# Patient Record
Sex: Female | Born: 1995 | Race: Black or African American | Hispanic: No | Marital: Married | Smoking: Never smoker
Health system: Southern US, Community
[De-identification: ages and names within clinical notes are randomized; demographics above are authoritative.]

## PROBLEM LIST (undated history)

## (undated) DIAGNOSIS — O24419 Gestational diabetes mellitus in pregnancy, unspecified control: Secondary | ICD-10-CM

## (undated) DIAGNOSIS — G43909 Migraine, unspecified, not intractable, without status migrainosus: Secondary | ICD-10-CM

## (undated) DIAGNOSIS — J45909 Unspecified asthma, uncomplicated: Secondary | ICD-10-CM

## (undated) HISTORY — DX: Migraine, unspecified, not intractable, without status migrainosus: G43.909

## (undated) HISTORY — DX: Gestational diabetes mellitus in pregnancy, unspecified control: O24.419

## (undated) HISTORY — PX: NO PAST SURGERIES: SHX2092

## (undated) HISTORY — DX: Unspecified asthma, uncomplicated: J45.909

---
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
## 2015-01-12 ENCOUNTER — Ambulatory Visit (INDEPENDENT_AMBULATORY_CARE_PROVIDER_SITE_OTHER): Payer: BLUE CROSS/BLUE SHIELD | Admitting: Internal Medicine

## 2015-01-12 VITALS — BP 124/80 | HR 116 | Temp 99.2°F | Resp 18 | Ht 61.0 in | Wt 115.0 lb

## 2015-01-12 DIAGNOSIS — A09 Infectious gastroenteritis and colitis, unspecified: Secondary | ICD-10-CM

## 2015-01-12 DIAGNOSIS — R110 Nausea: Secondary | ICD-10-CM

## 2015-01-12 LAB — POCT URINALYSIS DIPSTICK
Glucose, UA: NEGATIVE
LEUKOCYTES UA: NEGATIVE
NITRITE UA: NEGATIVE
PROTEIN UA: 30
Spec Grav, UA: 1.025
UROBILINOGEN UA: 1
pH, UA: 6.5

## 2015-01-12 LAB — POCT URINE PREGNANCY: PREG TEST UR: NEGATIVE

## 2015-01-12 LAB — POCT UA - MICROSCOPIC ONLY
Casts, Ur, LPF, POC: NEGATIVE
Crystals, Ur, HPF, POC: NEGATIVE
YEAST UA: NEGATIVE

## 2015-01-12 LAB — POCT CBC
GRANULOCYTE PERCENT: 91.4 % — AB (ref 37–80)
HEMATOCRIT: 45.6 % (ref 37.7–47.9)
HEMOGLOBIN: 14.5 g/dL (ref 12.2–16.2)
Lymph, poc: 0.6 (ref 0.6–3.4)
MCH, POC: 30.1 pg (ref 27–31.2)
MCHC: 31.7 g/dL — AB (ref 31.8–35.4)
MCV: 94.9 fL (ref 80–97)
MID (cbc): 0.2 (ref 0–0.9)
MPV: 8.3 fL (ref 0–99.8)
POC GRANULOCYTE: 9.1 — AB (ref 2–6.9)
POC LYMPH PERCENT: 6.2 %L — AB (ref 10–50)
POC MID %: 2.4 % (ref 0–12)
Platelet Count, POC: 290 10*3/uL (ref 142–424)
RBC: 4.8 M/uL (ref 4.04–5.48)
RDW, POC: 13.6 %
WBC: 10 10*3/uL (ref 4.6–10.2)

## 2015-01-12 LAB — GLUCOSE, POCT (MANUAL RESULT ENTRY): POC Glucose: 117 mg/dl — AB (ref 70–99)

## 2015-01-12 MED ORDER — ONDANSETRON HCL 8 MG PO TABS: 8.0000 mg | ORAL_TABLET | Freq: Three times a day (TID) | ORAL | Status: DC | PRN

## 2015-01-12 MED ORDER — ONDANSETRON 4 MG PO TBDP
8.0000 mg | ORAL_TABLET | Freq: Once | ORAL | Status: AC
  Administered 2015-01-12: 8 mg via ORAL

## 2015-01-12 NOTE — Patient Instructions (Addendum)
Food Choices to Help Relieve Diarrhea When you have diarrhea, the foods you eat and your eating habits are very important. Choosing the right foods and drinks can help relieve diarrhea. Also, because diarrhea can last up to 7 days, you need to replace lost fluids and electrolytes (such as sodium, potassium, and chloride) in order to help prevent dehydration.  WHAT GENERAL GUIDELINES DO I NEED TO FOLLOW?  Slowly drink 1 cup (8 oz) of fluid for each episode of diarrhea. If you are getting enough fluid, your urine will be clear or pale yellow.  Eat starchy foods. Some good choices include white rice, white toast, pasta, low-fiber cereal, baked potatoes (without the skin), saltine crackers, and bagels.  Avoid large servings of any cooked vegetables.  Limit fruit to two servings per day. A serving is  cup or 1 small piece.  Choose foods with less than 2 g of fiber per serving.  Limit fats to less than 8 tsp (38 g) per day.  Avoid fried foods.  Eat foods that have probiotics in them. Probiotics can be found in certain dairy products.  Avoid foods and beverages that may increase the speed at which food moves through the stomach and intestines (gastrointestinal tract). Things to avoid include:  High-fiber foods, such as dried fruit, raw fruits and vegetables, nuts, seeds, and whole grain foods.  Spicy foods and high-fat foods.  Foods and beverages sweetened with high-fructose corn syrup, honey, or sugar alcohols such as xylitol, sorbitol, and mannitol. WHAT FOODS ARE RECOMMENDED? Grains White rice. White, French, or pita breads (fresh or toasted), including plain rolls, buns, or bagels. White pasta. Saltine, soda, or graham crackers. Pretzels. Low-fiber cereal. Cooked cereals made with water (such as cornmeal, farina, or cream cereals). Plain muffins. Matzo. Melba toast. Zwieback.  Vegetables Potatoes (without the skin). Strained tomato and vegetable juices. Most well-cooked and canned  vegetables without seeds. Tender lettuce. Fruits Cooked or canned applesauce, apricots, cherries, fruit cocktail, grapefruit, peaches, pears, or plums. Fresh bananas, apples without skin, cherries, grapes, cantaloupe, grapefruit, peaches, oranges, or plums.  Meat and Other Protein Products Baked or boiled chicken. Eggs. Tofu. Fish. Seafood. Smooth peanut butter. Ground or well-cooked tender beef, ham, veal, lamb, pork, or poultry.  Dairy Plain yogurt, kefir, and unsweetened liquid yogurt. Lactose-free milk, buttermilk, or soy milk. Plain hard cheese. Beverages Sport drinks. Clear broths. Diluted fruit juices (except prune). Regular, caffeine-free sodas such as ginger ale. Water. Decaffeinated teas. Oral rehydration solutions. Sugar-free beverages not sweetened with sugar alcohols. Other Bouillon, broth, or soups made from recommended foods.  The items listed above may not be a complete list of recommended foods or beverages. Contact your dietitian for more options. WHAT FOODS ARE NOT RECOMMENDED? Grains Whole grain, whole wheat, bran, or rye breads, rolls, pastas, crackers, and cereals. Wild or brown rice. Cereals that contain more than 2 g of fiber per serving. Corn tortillas or taco shells. Cooked or dry oatmeal. Granola. Popcorn. Vegetables Raw vegetables. Cabbage, broccoli, Brussels sprouts, artichokes, baked beans, beet greens, corn, kale, legumes, peas, sweet potatoes, and yams. Potato skins. Cooked spinach and cabbage. Fruits Dried fruit, including raisins and dates. Raw fruits. Stewed or dried prunes. Fresh apples with skin, apricots, mangoes, pears, raspberries, and strawberries.  Meat and Other Protein Products Chunky peanut butter. Nuts and seeds. Beans and lentils. Bacon.  Dairy High-fat cheeses. Milk, chocolate milk, and beverages made with milk, such as milk shakes. Cream. Ice cream. Sweets and Desserts Sweet rolls, doughnuts, and sweet breads. Pancakes   and waffles. Fats and  Oils Butter. Cream sauces. Margarine. Salad oils. Plain salad dressings. Olives. Avocados.  Beverages Caffeinated beverages (such as coffee, tea, soda, or energy drinks). Alcoholic beverages. Fruit juices with pulp. Prune juice. Soft drinks sweetened with high-fructose corn syrup or sugar alcohols. Other Coconut. Hot sauce. Chili powder. Mayonnaise. Gravy. Cream-based or milk-based soups.  The items listed above may not be a complete list of foods and beverages to avoid. Contact your dietitian for more information. WHAT SHOULD I DO IF I BECOME DEHYDRATED? Diarrhea can sometimes lead to dehydration. Signs of dehydration include dark urine and dry mouth and skin. If you think you are dehydrated, you should rehydrate with an oral rehydration solution. These solutions can be purchased at pharmacies, retail stores, or online.  Drink -1 cup (120-240 mL) of oral rehydration solution each time you have an episode of diarrhea. If drinking this amount makes your diarrhea worse, try drinking smaller amounts more often. For example, drink 1-3 tsp (5-15 mL) every 5-10 minutes.  A general rule for staying hydrated is to drink 1-2 L of fluid per day. Talk to your health care provider about the specific amount you should be drinking each day. Drink enough fluids to keep your urine clear or pale yellow. Document Released: 01/31/2004 Document Revised: 11/15/2013 Document Reviewed: 10/03/2013 [facility] Patient Information 2015 [place], [place]. This information is not intended to replace advice given to you by your health care provider. Make sure you discuss any questions you have with your health care provider. Nausea and Vomiting Nausea is a sick feeling that often comes before throwing up (vomiting). Vomiting is a reflex where stomach contents come out of your mouth. Vomiting can cause severe loss of body fluids (dehydration). Children and elderly adults can become dehydrated quickly, especially if they also have  diarrhea. Nausea and vomiting are symptoms of a condition or disease. It is important to find the cause of your symptoms. CAUSES   Direct irritation of the stomach lining. This irritation can result from increased acid production (gastroesophageal reflux disease), infection, food poisoning, taking certain medicines (such as nonsteroidal anti-inflammatory drugs), alcohol use, or tobacco use.  Signals from the brain.These signals could be caused by a headache, heat exposure, an inner ear disturbance, increased pressure in the brain from injury, infection, a tumor, or a concussion, pain, emotional stimulus, or metabolic problems.  An obstruction in the gastrointestinal tract (bowel obstruction).  Illnesses such as diabetes, hepatitis, gallbladder problems, appendicitis, kidney problems, cancer, sepsis, atypical symptoms of a heart attack, or eating disorders.  Medical treatments such as chemotherapy and radiation.  Receiving medicine that makes you sleep (general anesthetic) during surgery. DIAGNOSIS Your caregiver may ask for tests to be done if the problems do not improve after a few days. Tests may also be done if symptoms are severe or if the reason for the nausea and vomiting is not clear. Tests may include:  Urine tests.  Blood tests.  Stool tests.  Cultures (to look for evidence of infection).  X-rays or other imaging studies. Test results can help your caregiver make decisions about treatment or the need for additional tests. TREATMENT You need to stay well hydrated. Drink frequently but in small amounts.You may wish to drink water, sports drinks, clear broth, or eat frozen ice pops or gelatin dessert to help stay hydrated.When you eat, eating slowly may help prevent nausea.There are also some antinausea medicines that may help prevent nausea. HOME CARE INSTRUCTIONS   Take all medicine as directed  by your caregiver.  If you do not have an appetite, do not force yourself to  eat. However, you must continue to drink fluids.  If you have an appetite, eat a normal diet unless your caregiver tells you differently.  Eat a variety of complex carbohydrates (rice, wheat, potatoes, bread), lean meats, yogurt, fruits, and vegetables.  Avoid high-fat foods because they are more difficult to digest.  Drink enough water and fluids to keep your urine clear or pale yellow.  If you are dehydrated, ask your caregiver for specific rehydration instructions. Signs of dehydration may include:  Severe thirst.  Dry lips and mouth.  Dizziness.  Dark urine.  Decreasing urine frequency and amount.  Confusion.  Rapid breathing or pulse. SEEK IMMEDIATE MEDICAL CARE IF:   You have blood or brown flecks (like coffee grounds) in your vomit.  You have black or bloody stools.  You have a severe headache or stiff neck.  You are confused.  You have severe abdominal pain.  You have chest pain or trouble breathing.  You do not urinate at least once every 8 hours.  You develop cold or clammy skin.  You continue to vomit for longer than 24 to 48 hours.  You have a fever. MAKE SURE YOU:   Understand these instructions.  Will watch your condition.  Will get help right away if you are not doing well or get worse. Document Released: 11/10/2005 Document Revised: 02/02/2012 Document Reviewed: 04/09/2011 [facility] Patient Information 2015 [place], [place]. This information is not intended to replace advice given to you by your health care provider. Make sure you discuss any questions you have with your health care provider. Viral Gastroenteritis Viral gastroenteritis is also known as stomach flu. This condition affects the stomach and intestinal tract. It can cause sudden diarrhea and vomiting. The illness typically lasts 3 to 8 days. Most people develop an immune response that eventually gets rid of the virus. While this natural response develops, the virus can make you quite  ill. CAUSES  Many different viruses can cause gastroenteritis, such as rotavirus or noroviruses. You can catch one of these viruses by consuming contaminated food or water. You may also catch a virus by sharing utensils or other personal items with an infected person or by touching a contaminated surface. SYMPTOMS  The most common symptoms are diarrhea and vomiting. These problems can cause a severe loss of body fluids (dehydration) and a body salt (electrolyte) imbalance. Other symptoms may include:  Fever.  Headache.  Fatigue.  Abdominal pain. DIAGNOSIS  Your caregiver can usually diagnose viral gastroenteritis based on your symptoms and a physical exam. A stool sample may also be taken to test for the presence of viruses or other infections. TREATMENT  This illness typically goes away on its own. Treatments are aimed at rehydration. The most serious cases of viral gastroenteritis involve vomiting so severely that you are not able to keep fluids down. In these cases, fluids must be given through an intravenous line (IV). HOME CARE INSTRUCTIONS   Drink enough fluids to keep your urine clear or pale yellow. Drink small amounts of fluids frequently and increase the amounts as tolerated.  Ask your caregiver for specific rehydration instructions.  Avoid:  Foods high in sugar.  Alcohol.  Carbonated drinks.  Tobacco.  Juice.  Caffeine drinks.  Extremely hot or cold fluids.  Fatty, greasy foods.  Too much intake of anything at one time.  Dairy products until 24 to 48 hours after diarrhea stops.  You may consume probiotics. Probiotics are active cultures of beneficial bacteria. They may lessen the amount and number of diarrheal stools in adults. Probiotics can be found in yogurt with active cultures and in supplements.  Wash your hands well to avoid spreading the virus.  Only take over-the-counter or prescription medicines for pain, discomfort, or fever as directed by your  caregiver. Do not give aspirin to children. Antidiarrheal medicines are not recommended.  Ask your caregiver if you should continue to take your regular prescribed and over-the-counter medicines.  Keep all follow-up appointments as directed by your caregiver. SEEK IMMEDIATE MEDICAL CARE IF:   You are unable to keep fluids down.  You do not urinate at least once every 6 to 8 hours.  You develop shortness of breath.  You notice blood in your stool or vomit. This may look like coffee grounds.  You have abdominal pain that increases or is concentrated in one small area (localized).  You have persistent vomiting or diarrhea.  You have a fever.  The patient is a child younger than 3 months, and he or she has a fever.  The patient is a child older than 3 months, and he or she has a fever and persistent symptoms.  The patient is a child older than 3 months, and he or she has a fever and symptoms suddenly get worse.  The patient is a baby, and he or she has no tears when crying. MAKE SURE YOU:   Understand these instructions.  Will watch your condition.  Will get help right away if you are not doing well or get worse. Document Released: 11/10/2005 Document Revised: 02/02/2012 Document Reviewed: 08/27/2011 [facility] Patient Information 2015 [place], [place]. This information is not intended to replace advice given to you by your health care provider. Make sure you discuss any questions you have with your health care provider.

## 2015-01-12 NOTE — Progress Notes (Signed)
   Subjective:    Patient ID: Deanna Patterson, female    DOB: 01/20/1996, 19 y.o.   MRN: [id number]  HPI Has less than 24 hrs of nausea and vomiting. No diarrhea or abdominal pain. No hx of GI disease. On depoprovera for [facility]. Has low grade fever and fatigue. No uti sxs. Baby has GI bug  Review of Systems     Objective:   Physical Exam  Constitutional: She is oriented to person, place, and time. She appears well-developed and well-nourished. No distress.  HENT:  Head: Normocephalic.  Mouth/Throat: Oropharynx is clear and moist.  Eyes: Conjunctivae and EOM are normal. Pupils are equal, round, and reactive to light. No scleral icterus.  Neck: Normal range of motion.  Cardiovascular: Normal rate, regular rhythm and normal heart sounds.   Pulmonary/Chest: Effort normal and breath sounds normal.  Abdominal: Bowel sounds are normal. She exhibits no distension and no mass. There is no tenderness. There is no rebound and no guarding.  Musculoskeletal: Normal range of motion.  Neurological: She is alert and oriented to person, place, and time. She exhibits normal muscle tone. Coordination normal.  Psychiatric: She has a normal mood and affect.  Vitals reviewed.   Zofran 8mg  sl for vomiting  Results for orders placed or performed in visit on 01/12/15  POCT urinalysis dipstick  Result Value Ref Range   Color, UA yellow    Clarity, UA hazy    Glucose, UA neg    Bilirubin, UA small    Ketones, UA >=160    Spec Grav, UA 1.025    Blood, UA large    pH, UA 6.5    Protein, UA 30    Urobilinogen, UA 1.0    Nitrite, UA neg    Leukocytes, UA Negative   POCT UA - Microscopic Only  Result Value Ref Range   WBC, Ur, HPF, POC 1-2    RBC, urine, microscopic 2-5    Bacteria, U Microscopic 2+    Mucus, UA small    Epithelial cells, urine per micros 3-8    Crystals, Ur, HPF, POC neg    Casts, Ur, LPF, POC neg    Yeast, UA neg   POCT CBC  Result Value Ref Range   WBC 10.0 4.6 - 10.2 K/uL   Lymph, poc 0.6 0.6 - 3.4   POC LYMPH PERCENT 6.2 (A) 10 - 50 %L   MID (cbc) 0.2 0 - 0.9   POC MID % 2.4 0 - 12 %M   POC Granulocyte 9.1 (A) 2 - 6.9   Granulocyte percent 91.4 (A) 37 - 80 %G   RBC 4.80 4.04 - 5.48 M/uL   Hemoglobin 14.5 12.2 - 16.2 g/dL   HCT, POC 78.245.6 95.637.7 - 47.9 %   MCV 94.9 80 - 97 fL   MCH, POC 30.1 27 - 31.2 pg   MCHC 31.7 (A) 31.8 - 35.4 g/dL   RDW, POC 21.313.6 %   Platelet Count, POC 290 142 - 424 K/uL   MPV 8.3 0 - 99.8 fL  POCT glucose (manual entry)  Result Value Ref Range   POC Glucose 117 (A) 70 - 99 mg/dl  POCT urine pregnancy  Result Value Ref Range   Preg Test, Ur Negative        Assessment & Plan:  BRAT diet Zofran 8mg  prn Gastroenteritis

## 2016-05-12 ENCOUNTER — Ambulatory Visit (INDEPENDENT_AMBULATORY_CARE_PROVIDER_SITE_OTHER): Payer: BC Managed Care – PPO | Admitting: Family Medicine

## 2016-05-12 VITALS — BP 110/72 | HR 81 | Temp 98.3°F | Resp 16 | Ht 61.0 in | Wt 112.4 lb

## 2016-05-12 DIAGNOSIS — J302 Other seasonal allergic rhinitis: Secondary | ICD-10-CM

## 2016-05-12 DIAGNOSIS — J029 Acute pharyngitis, unspecified: Secondary | ICD-10-CM | POA: Diagnosis not present

## 2016-05-12 DIAGNOSIS — J069 Acute upper respiratory infection, unspecified: Secondary | ICD-10-CM

## 2016-05-12 LAB — POCT RAPID STREP A (OFFICE): RAPID STREP A SCREEN: NEGATIVE

## 2016-05-12 MED ORDER — FLUTICASONE PROPIONATE 50 MCG/ACT NA SUSP: NASAL | Status: DC

## 2016-05-12 NOTE — Patient Instructions (Addendum)
IF you received an x-ray today, you will receive an invoice from [facility]. Please contact [facility] at [phone] with questions or concerns regarding your invoice.   IF you received labwork today, you will receive an invoice from [facility]. Please contact [facility] at [phone] with questions or concerns regarding your invoice.   Our billing staff will not be able to assist you with questions regarding bills from these companies.  You will be contacted with the lab results as soon as they are available. The fastest way to get your results is to activate your My Chart account. Instructions are located on the last page of this paperwork. If you have not heard from us regarding the results in 2 weeks, please contact this office.   Upper Respiratory Infection, Adult Most upper respiratory infections (URIs) are a viral infection of the air passages leading to the lungs. A URI affects the nose, throat, and upper air passages. The most common type of URI is nasopharyngitis and is typically referred to as "the common cold." URIs run their course and usually go away on their own. Most of the time, a URI does not require medical attention, but sometimes a bacterial infection in the upper airways can follow a viral infection. This is called a secondary infection. Sinus and middle ear infections are common types of secondary upper respiratory infections. Bacterial pneumonia can also complicate a URI. A URI can worsen asthma and chronic obstructive pulmonary disease (COPD). Sometimes, these complications can require emergency medical care and may be life threatening.  CAUSES Almost all URIs are caused by viruses. A virus is a type of germ and can spread from one person to another.  RISKS FACTORS You may be at risk for a URI if:   You smoke.   You have chronic heart or lung disease.  You have a weakened defense (immune) system.   You are very young  or very old.   You have nasal allergies or asthma.  You work in crowded or poorly ventilated areas.  You work in health care facilities or schools. SIGNS AND SYMPTOMS  Symptoms typically develop 2-3 days after you come in contact with a cold virus. Most viral URIs last 7-10 days. However, viral URIs from the influenza virus (flu virus) can last 14-18 days and are typically more severe. Symptoms may include:   Runny or stuffy (congested) nose.   Sneezing.   Cough.   Sore throat.   Headache.   Fatigue.   Fever.   Loss of appetite.   Pain in your forehead, behind your eyes, and over your cheekbones (sinus pain).  Muscle aches.  DIAGNOSIS  Your health care provider may diagnose a URI by:  Physical exam.  Tests to check that your symptoms are not due to another condition such as:  Strep throat.  Sinusitis.  Pneumonia.  Asthma. TREATMENT  A URI goes away on its own with time. It cannot be cured with medicines, but medicines may be prescribed or recommended to relieve symptoms. Medicines may help:  Reduce your fever.  Reduce your cough.  Relieve nasal congestion. HOME CARE INSTRUCTIONS   Take medicines only as directed by your health care provider.   Gargle warm saltwater or take cough drops to comfort your throat as directed by your health care provider.  Use a warm mist humidifier or inhale steam from a shower to increase air moisture. This may make it easier to breathe.  Drink enough fluid to keep your   urine clear or pale yellow.   Eat soups and other clear broths and maintain good nutrition.   Rest as needed.   Return to work when your temperature has returned to normal or as your health care provider advises. You may need to stay home longer to avoid infecting others. You can also use a face mask and careful hand washing to prevent spread of the virus.  Increase the usage of your inhaler if you have asthma.   Do not use any tobacco  products, including cigarettes, chewing tobacco, or electronic cigarettes. If you need help quitting, ask your health care provider. PREVENTION  The best way to protect yourself from getting a cold is to practice good hygiene.   Avoid oral or hand contact with people with cold symptoms.   Wash your hands often if contact occurs.  There is no clear evidence that vitamin C, vitamin E, echinacea, or exercise reduces the chance of developing a cold. However, it is always recommended to get plenty of rest, exercise, and practice good nutrition.  SEEK MEDICAL CARE IF:   You are getting worse rather than better.   Your symptoms are not controlled by medicine.   You have chills.  You have worsening shortness of breath.  You have brown or red mucus.  You have yellow or brown nasal discharge.  You have pain in your face, especially when you bend forward.  You have a fever.  You have swollen neck glands.  You have pain while swallowing.  You have white areas in the back of your throat. SEEK IMMEDIATE MEDICAL CARE IF:   You have severe or persistent:  Headache.  Ear pain.  Sinus pain.  Chest pain.  You have chronic lung disease and any of the following:  Wheezing.  Prolonged cough.  Coughing up blood.  A change in your usual mucus.  You have a stiff neck.  You have changes in your:  Vision.  Hearing.  Thinking.  Mood. MAKE SURE YOU:   Understand these instructions.  Will watch your condition.  Will get help right away if you are not doing well or get worse.   This information is not intended to replace advice given to you by your health care provider. Make sure you discuss any questions you have with your health care provider.   Document Released: 05/06/2001 Document Revised: 03/27/2015 Document Reviewed: 02/15/2014 [employer] Interactive Patient Education 2016 [employer]  

## 2016-05-12 NOTE — Progress Notes (Signed)
[facility] [address], [address] [place], [place] [id number] [phone] [phone]  Date:  05/12/2016   Name:  Deanna Patterson   DOB:  03/21/1996   MRN:  [id number]  PCP:  No primary care provider on file.    Chief Complaint: Sore Throat   History of Present Illness:  Deanna Patterson is a 20 y.o. very pleasant female patient who presents with the following: URI symptoms: eye crusting, sore throat, congestion - Began 2 days ago.  - Daughter 2 yo and was sick last week, with URI symptoms.  - Working at the [employer] - Cough drops attempted with minimal improvement.   - Numbing sprays not helping - Warm compresses helping for eye.  - Crusting of eyes worst in the AM, both eyes.    There are no active problems to display for this patient.   Past Medical History  Diagnosis Date  . Asthma     History reviewed. No pertinent past surgical history.  Social History  Substance Use Topics  . Smoking status: Never Smoker   . Smokeless tobacco: None  . Alcohol Use: No    Family History  Problem Relation Age of Onset  . Hyperlipidemia Mother   . Hypertension Mother   . Hyperlipidemia Maternal Grandmother   . Hypertension Maternal Grandmother     Allergies  Allergen Reactions  . Peanuts [Peanut Oil]   . Penicillins Rash    Medication list has been reviewed and updated.  Current Outpatient Prescriptions on File Prior to Visit  Medication Sig Dispense Refill  . medroxyPROGESTERone (DEPO-PROVERA) 150 MG/ML injection Inject 150 mg into the muscle every 3 (three) months.    Marland Kitchen. albuterol (PROVENTIL HFA;VENTOLIN HFA) 108 (90 BASE) MCG/ACT inhaler Inhale 2 puffs into the lungs every 6 (six) hours as needed for wheezing. (Patient not taking: Reported on 01/12/2015) 1 Inhaler 10  . azithromycin (ZITHROMAX Z-PAK) 250 MG tablet Take as directed on pack (Patient not taking: Reported on 01/12/2015) 6 tablet 0  . fluticasone (FLONASE) 50 MCG/ACT nasal  spray Use 2 sprays each nostril twice daily for 3 days, then continue 2 sprays once daily (Patient not taking: Reported on 01/12/2015) 16 g 1  . HYDROcodone-homatropine (HYCODAN) 5-1.5 MG/5ML syrup Take 5 mLs by mouth every 8 (eight) hours as needed for cough. (Patient not taking: Reported on 01/12/2015) 120 mL 0  . ondansetron (ZOFRAN) 8 MG tablet Take 1 tablet (8 mg total) by mouth every 8 (eight) hours as needed for nausea or vomiting. (Patient not taking: Reported on 05/12/2016) 20 tablet 0   No current facility-administered medications on file prior to visit.    Review of Systems:  Review of Systems  Constitutional: Positive for chills and malaise/fatigue. Negative for fever.  HENT: Positive for congestion and sore throat. Negative for ear discharge, ear pain and nosebleeds.   Eyes: Positive for blurred vision.  Respiratory: Positive for cough (mild, wet). Negative for hemoptysis, sputum production, shortness of breath and wheezing.   Cardiovascular: Positive for chest pain.  Gastrointestinal: Negative for nausea, vomiting, abdominal pain, diarrhea and constipation.  Musculoskeletal: Negative for myalgias.  Skin: Negative for itching and rash.  Neurological: Positive for headaches (tight around entire head). Negative for dizziness.    Physical Examination: Filed Vitals:   05/12/16 0931  BP: 110/72  Pulse: 81  Temp: 98.3 F (36.8 C)  Resp: 16   Filed Vitals:   05/12/16 0931  Height: 5\' 1"  (1.549 m)  Weight: 112 lb 6.4 oz (50.984 kg)   Body mass index is 21.25 kg/(m^2). Ideal Body Weight: Weight in (lb) to have BMI = 25: 132  GEN: WDWN, NAD, Non-toxic, A & O x 3 HEENT: Atraumatic, Normocephalic. Neck supple. No masses, No LAD. 2+ symmetric, nonerythematous tonsils with white concretions.  B/l mild conjunctival injection.  NO crusting or d/c from eye.  Ears and Nose: No external deformity. CV: RRR, No M/G/R. No JVD. No thrill. No extra heart sounds. PULM: CTA B, no wheezes,  crackles, rhonchi. No retractions. No resp. distress. No accessory muscle use. ABD: S, NT, ND, EXTR: No c/c/e NEURO Normal gait.  PSYCH: Normally interactive. Conversant. Not depressed or anxious appearing.  Calm demeanor.   Assessment and Plan: Viral URI: Rapid strept neg.  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   - work note provided x 1 day.  May need to remain out of work for several additional days depending on symptoms.  - Continue symptomatic management: warm compresses, tea with honey, flonase or nasal saline.  Discussed netti pot.  - Return precautions discussed. Will check temp prior to returning to work.    Signed Guinevere Scarlet, MD

## 2016-05-14 LAB — CULTURE, GROUP A STREP: ORGANISM ID, BACTERIA: NORMAL

## 2017-04-30 DIAGNOSIS — R87610 Atypical squamous cells of undetermined significance on cytologic smear of cervix (ASC-US): Secondary | ICD-10-CM | POA: Insufficient documentation

## 2017-11-25 ENCOUNTER — Ambulatory Visit: Payer: BC Managed Care – PPO | Admitting: Emergency Medicine

## 2017-11-25 ENCOUNTER — Encounter: Payer: Self-pay | Admitting: Emergency Medicine

## 2017-11-25 ENCOUNTER — Other Ambulatory Visit: Payer: Self-pay

## 2017-11-25 VITALS — BP 110/60 | HR 101 | Temp 98.2°F | Resp 18 | Ht 61.0 in | Wt 131.4 lb

## 2017-11-25 DIAGNOSIS — R197 Diarrhea, unspecified: Secondary | ICD-10-CM | POA: Diagnosis not present

## 2017-11-25 DIAGNOSIS — K529 Noninfective gastroenteritis and colitis, unspecified: Secondary | ICD-10-CM | POA: Diagnosis not present

## 2017-11-25 DIAGNOSIS — R112 Nausea with vomiting, unspecified: Secondary | ICD-10-CM

## 2017-11-25 MED ORDER — ONDANSETRON HCL 4 MG PO TABS: 4.0000 mg | ORAL_TABLET | Freq: Three times a day (TID) | ORAL | 0 refills | Status: DC | PRN

## 2017-11-25 MED ORDER — ONDANSETRON 4 MG PO TBDP
4.0000 mg | ORAL_TABLET | Freq: Once | ORAL | Status: AC
  Administered 2017-11-25: 4 mg via ORAL

## 2017-11-25 NOTE — Patient Instructions (Addendum)
   IF you received an x-ray today, you will receive an invoice from [facility]. Please contact [facility] at [phone] with questions or concerns regarding your invoice.   IF you received labwork today, you will receive an invoice from [facility]. Please contact [facility] at [phone] with questions or concerns regarding your invoice.   Our billing staff will not be able to assist you with questions regarding bills from these companies.  You will be contacted with the lab results as soon as they are available. The fastest way to get your results is to activate your My Chart account. Instructions are located on the last page of this paperwork. If you have not heard from us regarding the results in 2 weeks, please contact this office.     Diarrhea, Adult Diarrhea is when you have loose and water poop (stool) often. Diarrhea can make you feel weak and cause you to get dehydrated. Dehydration can make you tired and thirsty, make you have a dry mouth, and make it so you pee (urinate) less often. Diarrhea often lasts 2-3 days. However, it can last longer if it is a sign of something more serious. It is important to treat your diarrhea as told by your doctor. Follow these instructions at home: Eating and drinking  Follow these recommendations as told by your doctor:  Take an oral rehydration solution (ORS). This is a drink that is sold at pharmacies and stores.  Drink clear fluids, such as: ? Water. ? Ice chips. ? Diluted fruit juice. ? Low-calorie sports drinks.  Eat bland, easy-to-digest foods in small amounts as you are able. These foods include: ? Bananas. ? Applesauce. ? Rice. ? Low-fat (lean) meats. ? Toast. ? Crackers.  Avoid drinking fluids that have a lot of sugar or caffeine in them.  Avoid alcohol.  Avoid spicy or fatty foods.  General instructions   Drink enough fluid to keep your pee (urine) clear or pale yellow.  Wash your hands often. If  you cannot use soap and water, use hand sanitizer.  Make sure that all people in your home wash their hands well and often.  Take over-the-counter and prescription medicines only as told by your doctor.  Rest at home while you get better.  Watch your condition for any changes.  Take a warm bath to help with any burning or pain from having diarrhea.  Keep all follow-up visits as told by your doctor. This is important. Contact a doctor if:  You have a fever.  Your diarrhea gets worse.  You have new symptoms.  You cannot keep fluids down.  You feel light-headed or dizzy.  You have a headache.  You have muscle cramps. Get help right away if:  You have chest pain.  You feel very weak or you pass out (faint).  You have bloody or black poop or poop that look like tar.  You have very bad pain, cramping, or bloating in your belly (abdomen).  You have trouble breathing or you are breathing very quickly.  Your heart is beating very quickly.  Your skin feels cold and clammy.  You feel confused.  You have signs of dehydration, such as: ? Dark pee, hardly any pee, or no pee. ? Cracked lips. ? Dry mouth. ? Sunken eyes. ? Sleepiness. ? Weakness. This information is not intended to replace advice given to you by your health care provider. Make sure you discuss any questions you have with your health care provider. Document Released: 04/28/2008   Document Revised: 05/30/2016 Document Reviewed: 07/17/2015 Elsevier Interactive Patient Education  2018 Elsevier Inc.  

## 2017-11-25 NOTE — Progress Notes (Signed)
Deanna Patterson 21 y.o.   Chief Complaint  Patient presents with  . Diarrhea    started yesterday   . Nausea  . Emesis  . Headache    HISTORY OF PRESENT ILLNESS: This is a 22 y.o. female complaining of headache followed by watery non-bloody diarrhea and vomiting that started yesterday am. Denies abdominal pain, pregnancy, syncope, fever, chills or any other significant symptoms.  HPI   Prior to Admission medications   Medication Sig Start Date End Date Taking? Authorizing Provider  medroxyPROGESTERone (DEPO-PROVERA) 150 MG/ML injection Inject 150 mg into the muscle every 3 (three) months.   Yes [provider]  albuterol (PROVENTIL HFA;VENTOLIN HFA) 108 (90 BASE) MCG/ACT inhaler Inhale 2 puffs into the lungs every 6 (six) hours as needed for wheezing. Patient not taking: Reported on 01/12/2015 12/05/12   Elvina SidleLauenstein, Kurt, MD  azithromycin (ZITHROMAX Z-PAK) 250 MG tablet Take as directed on pack Patient not taking: Reported on 01/12/2015 12/05/12   Elvina SidleLauenstein, Kurt, MD  fluticasone [facility](FLONASE) 50 MCG/ACT nasal spray Use 2 sprays each nostril twice daily for 3 days, then continue 2 sprays once daily Patient not taking: Reported on 11/25/2017 05/12/16   Guinevere ScarletWilliams, Blake, MD  HYDROcodone-homatropine [facility](HYCODAN) 5-1.5 MG/5ML syrup Take 5 mLs by mouth every 8 (eight) hours as needed for cough. Patient not taking: Reported on 01/12/2015 12/05/12   Elvina SidleLauenstein, Kurt, MD  ondansetron (ZOFRAN) 4 MG tablet Take 1 tablet (4 mg total) by mouth every 8 (eight) hours as needed for nausea or vomiting. 11/25/17   Georgina QuintSagardia, Deshon Koslowski Jose, MD    Allergies  Allergen Reactions  . Eggs Or Egg-Derived Products   . Peanuts [Peanut Oil]   . Penicillins Rash    There are no active problems to display for this patient.   Past Medical History:  Diagnosis Date  . Asthma     No past surgical history on file.  Social History   Socioeconomic History  . Marital status: Single    Spouse name: Not on file    . Number of children: Not on file  . Years of education: Not on file  . Highest education level: Not on file  Social Needs  . Financial resource strain: Not on file  . Food insecurity - worry: Not on file  . Food insecurity - inability: Not on file  . Transportation needs - medical: Not on file  . Transportation needs - non-medical: Not on file  Occupational History  . Not on file  Tobacco Use  . Smoking status: Never Smoker  . Smokeless tobacco: Never Used  Substance and Sexual Activity  . Alcohol use: No    Alcohol/week: 0.0 oz  . Drug use: No  . Sexual activity: No    Birth control/protection: Abstinence  Other Topics Concern  . Not on file  Social History Narrative  . Not on file    Family History  Problem Relation Age of Onset  . Hyperlipidemia Mother   . Hypertension Mother   . Hyperlipidemia Maternal Grandmother   . Hypertension Maternal Grandmother      Review of Systems  Constitutional: Positive for malaise/fatigue. Negative for chills and fever.  HENT: Negative.   Eyes: Negative.   Respiratory: Negative.  Negative for cough and shortness of breath.   Cardiovascular: Negative.  Negative for chest pain and palpitations.  Gastrointestinal: Positive for diarrhea, nausea and vomiting. Negative for abdominal pain and blood in stool.  Genitourinary: Negative.  Negative for dysuria and hematuria.  Musculoskeletal: Negative for  myalgias and neck pain.  Skin: Negative for rash.  Neurological: Positive for weakness and headaches. Negative for dizziness.  Endo/Heme/Allergies: Negative.   All other systems reviewed and are negative.  Vitals:   11/25/17 0857  BP: 110/60  Pulse: (!) 101  Resp: 18  Temp: 98.2 F (36.8 C)  SpO2: 100%     Physical Exam  Constitutional: She is oriented to person, place, and time. She appears well-developed and well-nourished.  HENT:  Head: Normocephalic and atraumatic.  Right Ear: External ear normal.  Left Ear: External ear  normal.  Nose: Nose normal.  Mouth/Throat: Oropharynx is clear and moist.  Eyes: Conjunctivae and EOM are normal. Pupils are equal, round, and reactive to light.  Neck: Normal range of motion. Neck supple. No JVD present. No thyromegaly present.  Cardiovascular: Normal rate, regular rhythm and normal heart sounds.  Pulmonary/Chest: Effort normal and breath sounds normal.  Abdominal: Soft. Bowel sounds are normal. She exhibits no distension. There is no tenderness.  Musculoskeletal: Normal range of motion.  Lymphadenopathy:    She has no cervical adenopathy.  Neurological: She is alert and oriented to person, place, and time. No sensory deficit. She exhibits normal muscle tone.  Skin: Skin is warm and dry. Capillary refill takes less than 2 seconds. No rash noted.  Psychiatric: She has a normal mood and affect. Her behavior is normal.  Vitals reviewed.  A total of 25 minutes was spent in the room with the patient, greater than 50% of which was in counseling/coordination of care regarding present condition and how to avoid dehydration.   ASSESSMENT & PLAN: Deanna Patterson was seen today for diarrhea, nausea, emesis and headache.  Diagnoses and all orders for this visit:  Intractable vomiting with nausea, unspecified vomiting type -     ondansetron (ZOFRAN-ODT) disintegrating tablet 4 mg -     Discontinue: ondansetron (ZOFRAN) 4 MG tablet; Take 1 tablet (4 mg total) by mouth every 8 (eight) hours as needed for nausea or vomiting. -     ondansetron (ZOFRAN) 4 MG tablet; Take 1 tablet (4 mg total) by mouth every 8 (eight) hours as needed for nausea or vomiting.  Diarrhea of presumed infectious origin  Acute gastroenteritis    Patient Instructions       IF you received an x-ray today, you will receive an invoice from [facility]. Please contact [facility] at [phone] with questions or concerns regarding your invoice.   IF you received labwork today, you will  receive an invoice from [facility]. Please contact [facility] at [phone] with questions or concerns regarding your invoice.   Our billing staff will not be able to assist you with questions regarding bills from these companies.  You will be contacted with the lab results as soon as they are available. The fastest way to get your results is to activate your My Chart account. Instructions are located on the last page of this paperwork. If you have not heard from [place] regarding the results in 2 weeks, please contact this office.     Diarrhea, Adult Diarrhea is when you have loose and water poop (stool) often. Diarrhea can make you feel weak and cause you to get dehydrated. Dehydration can make you tired and thirsty, make you have a dry mouth, and make it so you pee (urinate) less often. Diarrhea often lasts 2-3 days. However, it can last longer if it is a sign of something more serious. It is important to treat your diarrhea as told by your  doctor. Follow these instructions at home: Eating and drinking  Follow these recommendations as told by your doctor:  Take an oral rehydration solution (ORS). This is a drink that is sold at pharmacies and stores.  Drink clear fluids, such as: ? Water. ? Ice chips. ? Diluted fruit juice. ? Low-calorie sports drinks.  Eat bland, easy-to-digest foods in small amounts as you are able. These foods include: ? Bananas. ? Applesauce. ? Rice. ? Low-fat (lean) meats. ? Toast. ? Crackers.  Avoid drinking fluids that have a lot of sugar or caffeine in them.  Avoid alcohol.  Avoid spicy or fatty foods.  General instructions   Drink enough fluid to keep your pee (urine) clear or pale yellow.  Wash your hands often. If you cannot use soap and water, use hand sanitizer.  Make sure that all people in your home wash their hands well and often.  Take over-the-counter and prescription medicines only as told by your doctor.  Rest at home while you get  better.  Watch your condition for any changes.  Take a warm bath to help with any burning or pain from having diarrhea.  Keep all follow-up visits as told by your doctor. This is important. Contact a doctor if:  You have a fever.  Your diarrhea gets worse.  You have new symptoms.  You cannot keep fluids down.  You feel light-headed or dizzy.  You have a headache.  You have muscle cramps. Get help right away if:  You have chest pain.  You feel very weak or you pass out (faint).  You have bloody or black poop or poop that look like tar.  You have very bad pain, cramping, or bloating in your belly (abdomen).  You have trouble breathing or you are breathing very quickly.  Your heart is beating very quickly.  Your skin feels cold and clammy.  You feel confused.  You have signs of dehydration, such as: ? Dark pee, hardly any pee, or no pee. ? Cracked lips. ? Dry mouth. ? Sunken eyes. ? Sleepiness. ? Weakness. This information is not intended to replace advice given to you by your health care provider. Make sure you discuss any questions you have with your health care provider. Document Released: 04/28/2008 Document Revised: 05/30/2016 Document Reviewed: 07/17/2015 Elsevier Interactive Patient Education  2018 Elsevier Inc.      Edwina Barth, MD [facility]

## 2018-01-01 ENCOUNTER — Encounter: Payer: Self-pay | Admitting: Emergency Medicine

## 2018-01-01 ENCOUNTER — Ambulatory Visit: Payer: BC Managed Care – PPO | Admitting: Emergency Medicine

## 2018-01-01 ENCOUNTER — Other Ambulatory Visit: Payer: Self-pay

## 2018-01-01 VITALS — BP 100/64 | HR 83 | Temp 98.4°F | Resp 16 | Ht 60.5 in | Wt 128.0 lb

## 2018-01-01 DIAGNOSIS — R51 Headache: Secondary | ICD-10-CM | POA: Diagnosis not present

## 2018-01-01 DIAGNOSIS — R519 Headache, unspecified: Secondary | ICD-10-CM

## 2018-01-01 MED ORDER — BUTALBITAL-APAP-CAFFEINE 50-325-40 MG PO TABS: 1.0000 | ORAL_TABLET | Freq: Four times a day (QID) | ORAL | 0 refills | Status: AC | PRN

## 2018-01-01 NOTE — Patient Instructions (Addendum)
     IF you received an x-ray today, you will receive an invoice from [facility]. Please contact [facility] at [phone] with questions or concerns regarding your invoice.   IF you received labwork today, you will receive an invoice from [facility]. Please contact [facility] at [phone] with questions or concerns regarding your invoice.   Our billing staff will not be able to assist you with questions regarding bills from these companies.  You will be contacted with the lab results as soon as they are available. The fastest way to get your results is to activate your My Chart account. Instructions are located on the last page of this paperwork. If you have not heard from us regarding the results in 2 weeks, please contact this office.     General Headache Without Cause A headache is pain or discomfort felt around the head or neck area. There are many causes and types of headaches. In some cases, the cause may not be found. Follow these instructions at home: Managing pain  Take over-the-counter and prescription medicines only as told by your doctor.  Lie down in a dark, quiet room when you have a headache.  If directed, apply ice to the head and neck area: ? Put ice in a plastic bag. ? Place a towel between your skin and the bag. ? Leave the ice on for 20 minutes, 2-3 times per day.  Use a heating pad or hot shower to apply heat to the head and neck area as told by your doctor.  Keep lights dim if bright lights bother you or make your headaches worse. Eating and drinking  Eat meals on a regular schedule.  Lessen how much alcohol you drink.  Lessen how much caffeine you drink, or stop drinking caffeine. General instructions  Keep all follow-up visits as told by your doctor. This is important.  Keep a journal to find out if certain things bring on headaches. For example, write down: ? What you eat and drink. ? How much sleep you get. ? Any change to  your diet or medicines.  Relax by getting a massage or doing other relaxing activities.  Lessen stress.  Sit up straight. Do not tighten (tense) your muscles.  Do not use tobacco products. This includes cigarettes, chewing tobacco, or e-cigarettes. If you need help quitting, ask your doctor.  Exercise regularly as told by your doctor.  Get enough sleep. This often means 7-9 hours of sleep. Contact a doctor if:  Your symptoms are not helped by medicine.  You have a headache that feels different than the other headaches.  You feel sick to your stomach (nauseous) or you throw up (vomit).  You have a fever. Get help right away if:  Your headache becomes really bad.  You keep throwing up.  You have a stiff neck.  You have trouble seeing.  You have trouble speaking.  You have pain in the eye or ear.  Your muscles are weak or you lose muscle control.  You lose your balance or have trouble walking.  You feel like you will pass out (faint) or you pass out.  You have confusion. This information is not intended to replace advice given to you by your health care provider. Make sure you discuss any questions you have with your health care provider. Document Released: 08/19/2008 Document Revised: 04/17/2016 Document Reviewed: 03/05/2015 Elsevier Interactive Patient Education  2018 Elsevier Inc.  

## 2018-01-01 NOTE — Progress Notes (Signed)
Deanna Patterson 22 y.o.   Chief Complaint  Patient presents with  . Headache    x 2 days  . Sinus Problem    sinus pressure    HISTORY OF PRESENT ILLNESS: This is a 22 y.o. female complaining of persistent throbbing headache for 2 days.  No history of migraine headaches.  Complaining of mild sinus pressure, nausea, light sensitivity.  Denies head injuries.  Denies syncope.  Denies flulike symptoms.  Denies fever or chills.  Denies any other significant symptoms.  Headache   This is a new problem. The current episode started in the past 7 days. The problem occurs constantly. The problem has been waxing and waning. The pain is located in the bilateral region. The pain quality is not similar to prior headaches. The quality of the pain is described as throbbing. The pain is at a severity of 7/10. The pain is moderate. Associated symptoms include insomnia, nausea, photophobia and sinus pressure. Pertinent negatives include no abdominal pain, abnormal behavior, anorexia, back pain, blurred vision, coughing, dizziness, ear pain, eye pain, eye redness, eye watering, facial sweating, fever, hearing loss, loss of balance, muscle aches, neck pain, numbness, phonophobia, rhinorrhea, scalp tenderness, sore throat, swollen glands, tingling, tinnitus, visual change, vomiting or weakness.     Prior to Admission medications   Medication Sig Start Date End Date Taking? Authorizing Provider  albuterol (PROVENTIL HFA;VENTOLIN HFA) 108 (90 BASE) MCG/ACT inhaler Inhale 2 puffs into the lungs every 6 (six) hours as needed for wheezing. Patient not taking: Reported on 01/01/2018 12/05/12   Elvina Sidle, MD  azithromycin (ZITHROMAX Z-PAK) 250 MG tablet Take as directed on pack Patient not taking: Reported on 01/12/2015 12/05/12   Elvina Sidle, MD  fluticasone [facility]) 50 MCG/ACT nasal spray Use 2 sprays each nostril twice daily for 3 days, then continue 2 sprays once daily Patient not taking: Reported on  11/25/2017 05/12/16   Guinevere Scarlet, MD  HYDROcodone-homatropine [facility]) 5-1.5 MG/5ML syrup Take 5 mLs by mouth every 8 (eight) hours as needed for cough. Patient not taking: Reported on 01/12/2015 12/05/12   Elvina Sidle, MD  medroxyPROGESTERone (DEPO-PROVERA) 150 MG/ML injection Inject 150 mg into the muscle every 3 (three) months.    [provider]  ondansetron (ZOFRAN) 4 MG tablet Take 1 tablet (4 mg total) by mouth every 8 (eight) hours as needed for nausea or vomiting. Patient not taking: Reported on 01/01/2018 11/25/17   Georgina Quint, MD    Allergies  Allergen Reactions  . Eggs Or Egg-Derived Products   . Peanuts [Peanut Oil]   . Penicillins Rash    Patient Active Problem List   Diagnosis Date Noted  . Intractable vomiting with nausea 11/25/2017  . Diarrhea of presumed infectious origin 11/25/2017  . Acute gastroenteritis 11/25/2017    Past Medical History:  Diagnosis Date  . Asthma     No past surgical history on file.  Social History   Socioeconomic History  . Marital status: Single    Spouse name: Not on file  . Number of children: Not on file  . Years of education: Not on file  . Highest education level: Not on file  Social Needs  . Financial resource strain: Not on file  . Food insecurity - worry: Not on file  . Food insecurity - inability: Not on file  . Transportation needs - medical: Not on file  . Transportation needs - non-medical: Not on file  Occupational History  . Not on file  Tobacco Use  .  Smoking status: Never Smoker  . Smokeless tobacco: Never Used  Substance and Sexual Activity  . Alcohol use: No    Alcohol/week: 0.0 oz  . Drug use: No  . Sexual activity: No    Birth control/protection: Abstinence  Other Topics Concern  . Not on file  Social History Narrative  . Not on file    Family History  Problem Relation Age of Onset  . Hyperlipidemia Mother   . Hypertension Mother   . Hyperlipidemia Maternal Grandmother    . Hypertension Maternal Grandmother      Review of Systems  Constitutional: Negative.  Negative for fever.  HENT: Positive for sinus pressure. Negative for ear pain, hearing loss, rhinorrhea, sore throat and tinnitus.   Eyes: Positive for photophobia. Negative for blurred vision, pain and redness.  Respiratory: Negative.  Negative for cough.   Cardiovascular: Negative.  Negative for chest pain and palpitations.  Gastrointestinal: Positive for nausea. Negative for abdominal pain, anorexia and vomiting.  Genitourinary: Negative.  Negative for dysuria and hematuria.  Musculoskeletal: Negative for back pain and neck pain.  Skin: Negative.  Negative for rash.  Neurological: Positive for headaches. Negative for dizziness, tingling, weakness, numbness and loss of balance.  Endo/Heme/Allergies: Negative.   Psychiatric/Behavioral: The patient has insomnia.   All other systems reviewed and are negative.   Vitals:   01/01/18 1347  BP: 100/64  Pulse: 83  Resp: 16  Temp: 98.4 F (36.9 C)  SpO2: 99%    Physical Exam  Constitutional: She is oriented to person, place, and time. She appears well-developed and well-nourished.  HENT:  Head: Normocephalic and atraumatic.  Right Ear: External ear normal.  Left Ear: External ear normal.  Nose: Nose normal.  Mouth/Throat: Oropharynx is clear and moist.  Eyes: Conjunctivae and EOM are normal. Pupils are equal, round, and reactive to light.  Neck: Normal range of motion. Neck supple. No JVD present. No thyromegaly present.  Cardiovascular: Normal rate, regular rhythm and normal heart sounds.  Pulmonary/Chest: Effort normal and breath sounds normal.  Abdominal: Soft. Bowel sounds are normal. She exhibits no distension. There is no tenderness.  Musculoskeletal: Normal range of motion. She exhibits no edema or tenderness.  Lymphadenopathy:    She has no cervical adenopathy.  Neurological: She is alert and oriented to person, place, and time. She  displays normal reflexes. No cranial nerve deficit or sensory deficit. She exhibits normal muscle tone. Coordination normal.  Skin: Skin is warm and dry. Capillary refill takes less than 2 seconds. No rash noted.  Psychiatric: She has a normal mood and affect. Her behavior is normal.  Vitals reviewed.    ASSESSMENT & PLAN: Kelley was seen today for headache and sinus problem.  Diagnoses and all orders for this visit:  Acute intractable headache, unspecified headache type -     butalbital-acetaminophen-caffeine (FIORICET, ESGIC) 50-325-40 MG tablet; Take 1-2 tablets by mouth every 6 (six) hours as needed for headache.    Patient Instructions       IF you received an x-ray today, you will receive an invoice from [facility]. Please contact [facility] at [phone] with questions or concerns regarding your invoice.   IF you received labwork today, you will receive an invoice from [facility]. Please contact [facility] at [phone] with questions or concerns regarding your invoice.   Our billing staff will not be able to assist you with questions regarding bills from these companies.  You will be contacted with the lab results as soon as they  are available. The fastest way to get your results is to activate your My Chart account. Instructions are located on the last page of this paperwork. If you have not heard from us regarding the results in 2 weeks, please contact this office.     General Headache Without Cause A headache is pain or discomfort felt around the head or neck area. There are many causes and types of headaches. In some cases, the cause may not be found. Follow these instructions at home: Managing pain  Take over-the-counter and prescription medicines only as told by your doctor.  Lie down in a dark, quiet room when you have a headache.  If directed, apply ice to the head and neck area: ? Put ice in a plastic bag. ? Place a towel between your  skin and the bag. ? Leave the ice on for 20 minutes, 2-3 times per day.  Use a heating pad or hot shower to apply heat to the head and neck area as told by your doctor.  Keep lights dim if bright lights bother you or make your headaches worse. Eating and drinking  Eat meals on a regular schedule.  Lessen how much alcohol you drink.  Lessen how much caffeine you drink, or stop drinking caffeine. General instructions  Keep all follow-up visits as told by your doctor. This is important.  Keep a journal to find out if certain things bring on headaches. For example, write down: ? What you eat and drink. ? How much sleep you get. ? Any change to your diet or medicines.  Relax by getting a massage or doing other relaxing activities.  Lessen stress.  Sit up straight. Do not tighten (tense) your muscles.  Do not use tobacco products. This includes cigarettes, chewing tobacco, or e-cigarettes. If you need help quitting, ask your doctor.  Exercise regularly as told by your doctor.  Get enough sleep. This often means 7-9 hours of sleep. Contact a doctor if:  Your symptoms are not helped by medicine.  You have a headache that feels different than the other headaches.  You feel sick to your stomach (nauseous) or you throw up (vomit).  You have a fever. Get help right away if:  Your headache becomes really bad.  You keep throwing up.  You have a stiff neck.  You have trouble seeing.  You have trouble speaking.  You have pain in the eye or ear.  Your muscles are weak or you lose muscle control.  You lose your balance or have trouble walking.  You feel like you will pass out (faint) or you pass out.  You have confusion. This information is not intended to replace advice given to you by your health care provider. Make sure you discuss any questions you have with your health care provider. Document Released: 08/19/2008 Document Revised: 04/17/2016 Document Reviewed:  03/05/2015 Elsevier Interactive Patient Education  2018 [employer]      Edwina BarthMiguel Aalijah Mims, MD [facility]

## 2018-02-12 ENCOUNTER — Ambulatory Visit: Payer: BC Managed Care – PPO | Admitting: Emergency Medicine

## 2018-02-12 ENCOUNTER — Encounter: Payer: Self-pay | Admitting: Emergency Medicine

## 2018-02-12 ENCOUNTER — Other Ambulatory Visit: Payer: Self-pay

## 2018-02-12 VITALS — BP 113/73 | HR 91 | Temp 98.9°F | Resp 16 | Ht 59.5 in | Wt 129.2 lb

## 2018-02-12 DIAGNOSIS — R0981 Nasal congestion: Secondary | ICD-10-CM | POA: Diagnosis not present

## 2018-02-12 DIAGNOSIS — R059 Cough, unspecified: Secondary | ICD-10-CM | POA: Insufficient documentation

## 2018-02-12 DIAGNOSIS — R05 Cough: Secondary | ICD-10-CM | POA: Diagnosis not present

## 2018-02-12 DIAGNOSIS — J22 Unspecified acute lower respiratory infection: Secondary | ICD-10-CM | POA: Diagnosis not present

## 2018-02-12 MED ORDER — PSEUDOEPHEDRINE-GUAIFENESIN ER 60-600 MG PO TB12: 1.0000 | ORAL_TABLET | Freq: Two times a day (BID) | ORAL | 1 refills | Status: AC

## 2018-02-12 MED ORDER — AZITHROMYCIN 250 MG PO TABS: ORAL_TABLET | ORAL | 0 refills | Status: DC

## 2018-02-12 MED ORDER — BENZONATATE 200 MG PO CAPS: 200.0000 mg | ORAL_CAPSULE | Freq: Two times a day (BID) | ORAL | 0 refills | Status: DC | PRN

## 2018-02-12 MED ORDER — PROMETHAZINE-CODEINE 6.25-10 MG/5ML PO SYRP: 5.0000 mL | ORAL_SOLUTION | Freq: Every evening | ORAL | 0 refills | Status: DC | PRN

## 2018-02-12 MED ORDER — PROMETHAZINE-CODEINE 6.25-10 MG/5ML PO SYRP
5.0000 mL | ORAL_SOLUTION | Freq: Every evening | ORAL | 0 refills | Status: DC | PRN
Start: 2018-02-12 — End: 2018-07-05

## 2018-02-12 NOTE — Progress Notes (Signed)
Deanna Patterson 22 y.o.   Chief Complaint  Patient presents with  . Cough    productive with yellow green mucus x 8 days  . Nasal Congestion    HISTORY OF PRESENT ILLNESS: This is a 22 y.o. female complaining of 7-8-day history of productive cough with nasal congestion and general malaise.  Cough  This is a new problem. The current episode started in the past 7 days. The problem has been gradually worsening. The problem occurs constantly. The cough is productive of sputum. Associated symptoms include nasal congestion. Pertinent negatives include no chest pain, chills, ear congestion, ear pain, eye redness, fever, headaches, heartburn, hemoptysis, myalgias, rash, rhinorrhea, sore throat, shortness of breath, weight loss or wheezing. Nothing aggravates the symptoms. Treatments tried: Advil, DayQuil, NyQuil. The treatment provided mild relief. There is no history of asthma, bronchitis, COPD, emphysema or pneumonia.     Prior to Admission medications   Medication Sig Start Date End Date Taking? Authorizing Provider  albuterol (PROVENTIL HFA;VENTOLIN HFA) 108 (90 BASE) MCG/ACT inhaler Inhale 2 puffs into the lungs every 6 (six) hours as needed for wheezing. 12/05/12  Yes Elvina Sidle, MD  butalbital-acetaminophen-caffeine (FIORICET, ESGIC) [phone] MG tablet Take 1-2 tablets by mouth every 6 (six) hours as needed for headache. 01/01/18 01/01/19 Yes Salinda Snedeker, Eilleen Kempf, MD  azithromycin (ZITHROMAX Z-PAK) 250 MG tablet Take as directed on pack Patient not taking: Reported on 01/12/2015 12/05/12   Elvina Sidle, MD  fluticasone [facility]) 50 MCG/ACT nasal spray Use 2 sprays each nostril twice daily for 3 days, then continue 2 sprays once daily Patient not taking: Reported on 11/25/2017 05/12/16   Guinevere Scarlet, MD  HYDROcodone-homatropine [facility]) 5-1.5 MG/5ML syrup Take 5 mLs by mouth every 8 (eight) hours as needed for cough. Patient not taking: Reported on 01/12/2015 12/05/12   Elvina Sidle, MD  medroxyPROGESTERone (DEPO-PROVERA) 150 MG/ML injection Inject 150 mg into the muscle every 3 (three) months.    [provider]  ondansetron (ZOFRAN) 4 MG tablet Take 1 tablet (4 mg total) by mouth every 8 (eight) hours as needed for nausea or vomiting. Patient not taking: Reported on 01/01/2018 11/25/17   Georgina Quint, MD    Allergies  Allergen Reactions  . Eggs Or Egg-Derived Products   . Peanuts [Peanut Oil]   . Penicillins Rash    Patient Active Problem List   Diagnosis Date Noted  . Acute intractable headache 01/01/2018  . Intractable vomiting with nausea 11/25/2017  . Diarrhea of presumed infectious origin 11/25/2017  . Acute gastroenteritis 11/25/2017    Past Medical History:  Diagnosis Date  . Asthma     No past surgical history on file.  Social History   Socioeconomic History  . Marital status: Single    Spouse name: Not on file  . Number of children: Not on file  . Years of education: Not on file  . Highest education level: Not on file  Occupational History  . Not on file  Social Needs  . Financial resource strain: Not on file  . Food insecurity:    Worry: Not on file    Inability: Not on file  . Transportation needs:    Medical: Not on file    Non-medical: Not on file  Tobacco Use  . Smoking status: Never Smoker  . Smokeless tobacco: Never Used  Substance and Sexual Activity  . Alcohol use: No    Alcohol/week: 0.0 oz  . Drug use: No  . Sexual activity: Never  Birth control/protection: Abstinence  Lifestyle  . Physical activity:    Days per week: Not on file    Minutes per session: Not on file  . Stress: Not on file  Relationships  . Social connections:    Talks on phone: Not on file    Gets together: Not on file    Attends religious service: Not on file    Active member of club or organization: Not on file    Attends meetings of clubs or organizations: Not on file    Relationship status: Not on file  . Intimate  partner violence:    Fear of current or ex partner: Not on file    Emotionally abused: Not on file    Physically abused: Not on file    Forced sexual activity: Not on file  Other Topics Concern  . Not on file  Social History Narrative  . Not on file    Family History  Problem Relation Age of Onset  . Hyperlipidemia Mother   . Hypertension Mother   . Hyperlipidemia Maternal Grandmother   . Hypertension Maternal Grandmother      Review of Systems  Constitutional: Negative for chills, fever and weight loss.  HENT: Positive for congestion. Negative for ear pain, nosebleeds, rhinorrhea, sinus pain and sore throat.   Eyes: Negative.  Negative for discharge and redness.  Respiratory: Positive for cough and sputum production. Negative for hemoptysis, shortness of breath and wheezing.   Cardiovascular: Negative.  Negative for chest pain and palpitations.  Gastrointestinal: Negative.  Negative for abdominal pain, heartburn, nausea and vomiting.  Genitourinary: Negative.  Negative for dysuria and hematuria.  Musculoskeletal: Negative for back pain, myalgias and neck pain.  Skin: Negative.  Negative for rash.  Neurological: Negative.  Negative for dizziness and headaches.  Endo/Heme/Allergies: Negative.   All other systems reviewed and are negative.   Vitals:   02/12/18 1350  BP: 113/73  Pulse: 91  Resp: 16  Temp: 98.9 F (37.2 C)  SpO2: 100%    Physical Exam  Constitutional: She is oriented to person, place, and time. She appears well-developed and well-nourished.  HENT:  Head: Normocephalic.  Right Ear: External ear normal.  Left Ear: External ear normal.  Nose: Nose normal.  Mouth/Throat: Oropharynx is clear and moist.  Eyes: Pupils are equal, round, and reactive to light. Conjunctivae and EOM are normal.  Neck: Normal range of motion. Neck supple. No JVD present. No thyromegaly present.  Cardiovascular: Normal rate, regular rhythm and normal heart sounds.    Pulmonary/Chest: Effort normal and breath sounds normal.  Abdominal: Soft. Bowel sounds are normal. She exhibits no distension. There is no tenderness.  Musculoskeletal: Normal range of motion.  Lymphadenopathy:    She has no cervical adenopathy.  Neurological: She is alert and oriented to person, place, and time. No sensory deficit. She exhibits normal muscle tone.  Skin: Skin is warm and dry. Capillary refill takes less than 2 seconds. No rash noted.  Psychiatric: She has a normal mood and affect. Her behavior is normal.  Vitals reviewed.    ASSESSMENT & PLAN: Tayen was seen today for cough and nasal congestion.  Diagnoses and all orders for this visit:  Cough -     Discontinue: promethazine-codeine (PHENERGAN WITH CODEINE) 6.25-10 MG/5ML syrup; Take 5 mLs by mouth at bedtime as needed for cough. -     benzonatate (TESSALON) 200 MG capsule; Take 1 capsule (200 mg total) by mouth 2 (two) times daily as needed for cough. -  promethazine-codeine (PHENERGAN WITH CODEINE) 6.25-10 MG/5ML syrup; Take 5 mLs by mouth at bedtime as needed for cough.  Lower respiratory infection -     azithromycin (ZITHROMAX) 250 MG tablet; Sig as indicated  Sinus congestion -     pseudoephedrine-guaifenesin (MUCINEX D) 60-600 MG 12 hr tablet; Take 1 tablet by mouth every 12 (twelve) hours for 5 days.    Patient Instructions       IF you received an x-ray today, you [facility]. Please contact [facility] at [phone] with questions or concerns regarding your invoice.   IF you received labwork today, you will receive an invoice from [facility]. Please contact [facility] at [phone] with questions or concerns regarding your invoice.   Our billing staff will not be able to assist you with questions regarding bills from these companies.  You will be contacted with the lab results as soon as they are available. The fastest way to get your results  is to activate your My Chart account. Instructions are located on the last page of this paperwork. If you have not heard from us regarding the results in 2 weeks, please contact this office.     Cough, Adult A cough helps to clear your throat and lungs. A cough may last only 2-3 weeks (acute), or it may last longer than 8 weeks (chronic). Many different things can cause a cough. A cough may be a sign of an illness or another medical condition. Follow these instructions at home:  Pay attention to any changes in your cough.  Take medicines only as told by your doctor. ? If you were prescribed an antibiotic medicine, take it as told by your doctor. Do not stop taking it even if you start to feel better. ? Talk with your doctor before you try using a cough medicine.  Drink enough fluid to keep your pee (urine) clear or pale yellow.  If the air is dry, use a cold steam vaporizer or humidifier in your home.  Stay away from things that make you cough at work or at home.  If your cough is worse at night, try using extra pillows to raise your head up higher while you sleep.  Do not smoke, and try not to be around smoke. If you need help quitting, ask your doctor.  Do not have caffeine.  Do not drink alcohol.  Rest as needed. Contact a doctor if:  You have new problems (symptoms).  You cough up yellow fluid (pus).  Your cough does not get better after 2-3 weeks, or your cough gets worse.  Medicine does not help your cough and you are not sleeping well.  You have pain that gets worse or pain that is not helped with medicine.  You have a fever.  You are losing weight and you do not know why.  You have night sweats. Get help right away if:  You cough up blood.  You have trouble breathing.  Your heartbeat is very fast. This information is not intended to replace advice given to you by your health care provider. Make sure you discuss any questions you have with your health care  provider. Document Released: 07/24/2011 Document Revised: 04/17/2016 Document Reviewed: 01/17/2015 Elsevier Interactive Patient Education  2018 [employer] Inc.      Edwina BarthMiguel Johannes Everage, MD [facility]

## 2018-02-12 NOTE — Patient Instructions (Addendum)
     IF you received an x-ray today, you will receive an invoice from [facility]. Please contact [facility] at [phone] with questions or concerns regarding your invoice.   IF you received labwork today, you will receive an invoice from [facility]. Please contact [facility] at [phone] with questions or concerns regarding your invoice.   Our billing staff will not be able to assist you with questions regarding bills from these companies.  You will be contacted with the lab results as soon as they are available. The fastest way to get your results is to activate your My Chart account. Instructions are located on the last page of this paperwork. If you have not heard from us regarding the results in 2 weeks, please contact this office.     Cough, Adult A cough helps to clear your throat and lungs. A cough may last only 2-3 weeks (acute), or it may last longer than 8 weeks (chronic). Many different things can cause a cough. A cough may be a sign of an illness or another medical condition. Follow these instructions at home:  Pay attention to any changes in your cough.  Take medicines only as told by your doctor. ? If you were prescribed an antibiotic medicine, take it as told by your doctor. Do not stop taking it even if you start to feel better. ? Talk with your doctor before you try using a cough medicine.  Drink enough fluid to keep your pee (urine) clear or pale yellow.  If the air is dry, use a cold steam vaporizer or humidifier in your home.  Stay away from things that make you cough at work or at home.  If your cough is worse at night, try using extra pillows to raise your head up higher while you sleep.  Do not smoke, and try not to be around smoke. If you need help quitting, ask your doctor.  Do not have caffeine.  Do not drink alcohol.  Rest as needed. Contact a doctor if:  You have new problems (symptoms).  You cough up yellow fluid  (pus).  Your cough does not get better after 2-3 weeks, or your cough gets worse.  Medicine does not help your cough and you are not sleeping well.  You have pain that gets worse or pain that is not helped with medicine.  You have a fever.  You are losing weight and you do not know why.  You have night sweats. Get help right away if:  You cough up blood.  You have trouble breathing.  Your heartbeat is very fast. This information is not intended to replace advice given to you by your health care provider. Make sure you discuss any questions you have with your health care provider. Document Released: 07/24/2011 Document Revised: 04/17/2016 Document Reviewed: 01/17/2015 Elsevier Interactive Patient Education  2018 [employer] Inc.  

## 2018-07-05 ENCOUNTER — Other Ambulatory Visit: Payer: Self-pay

## 2018-07-05 ENCOUNTER — Ambulatory Visit: Payer: BC Managed Care – PPO | Admitting: Emergency Medicine

## 2018-07-05 ENCOUNTER — Encounter: Payer: Self-pay | Admitting: Emergency Medicine

## 2018-07-05 VITALS — BP 120/72 | HR 71 | Temp 99.3°F | Resp 16 | Ht 59.5 in | Wt 131.2 lb

## 2018-07-05 DIAGNOSIS — H1032 Unspecified acute conjunctivitis, left eye: Secondary | ICD-10-CM | POA: Diagnosis not present

## 2018-07-05 MED ORDER — POLYMYXIN B-TRIMETHOPRIM 10000-0.1 UNIT/ML-% OP SOLN: 2.0000 [drp] | Freq: Four times a day (QID) | OPHTHALMIC | 1 refills | Status: AC

## 2018-07-05 NOTE — Patient Instructions (Addendum)
     IF you received an x-ray today, you will receive an invoice from [facility]. Please contact  Radiology at [phone] with questions or concerns regarding your invoice.   IF you received labwork today, you will receive an invoice from [facility]. Please contact [facility] at [phone] with questions or concerns regarding your invoice.   Our billing staff will not be able to assist you with questions regarding bills from these companies.  You will be contacted with the lab results as soon as they are available. The fastest way to get your results is to activate your My Chart account. Instructions are located on the last page of this paperwork. If you have not heard from us regarding the results in 2 weeks, please contact this office.      Bacterial Conjunctivitis Bacterial conjunctivitis is an infection of your conjunctiva. This is the clear membrane that covers the white part of your eye and the inner surface of your eyelid. This condition can make your eye:  Red or pink.  Itchy.  This condition is caused by bacteria. This condition spreads very easily from person to person (is contagious) and from one eye to the other eye. Follow these instructions at home: Medicines  Take or apply your antibiotic medicine as told by your doctor. Do not stop taking or applying the antibiotic even if you start to feel better.  Take or apply over-the-counter and prescription medicines only as told by your doctor.  Do not touch your eyelid with the eye drop bottle or the ointment tube. Managing discomfort  Wipe any fluid from your eye with a warm, wet washcloth or a cotton ball.  Place a cool, clean washcloth on your eye. Do this for 10-20 minutes, 3-4 times per day. General instructions  Do not wear contact lenses until the irritation is gone. Wear glasses until your doctor says it is okay to wear contacts.  Do not wear eye makeup until your symptoms are gone. Throw away  any old makeup.  Change or wash your pillowcase every day.  Do not share towels or washcloths with anyone.  Wash your hands often with soap and water. Use paper towels to dry your hands.  Do not touch or rub your eyes.  Do not drive or use heavy machinery if your vision is blurry. Contact a doctor if:  You have a fever.  Your symptoms do not get better after 10 days. Get help right away if:  You have a fever and your symptoms suddenly get worse.  You have very bad pain when you move your eye.  Your face: ? Hurts. ? Is red. ? Is swollen.  You have sudden loss of vision. This information is not intended to replace advice given to you by your health care provider. Make sure you discuss any questions you have with your health care provider. Document Released: 08/19/2008 Document Revised: 04/17/2016 Document Reviewed: 08/23/2015 Elsevier Interactive Patient Education  2018 Elsevier Inc.  

## 2018-07-05 NOTE — Progress Notes (Signed)
Deanna Patterson 22 y.o.   No chief complaint on file.   HISTORY OF PRESENT ILLNESS: This is a 22 y.o. female complaining of red and irritated left eye for 2 days.  No visual problems.  Does not wear contact lenses.  Noticed crusty sticky discharge from the eye this morning.  HPI   Prior to Admission medications   Medication Sig Start Date End Date Taking? Authorizing Provider  albuterol (PROVENTIL HFA;VENTOLIN HFA) 108 (90 BASE) MCG/ACT inhaler Inhale 2 puffs into the lungs every 6 (six) hours as needed for wheezing. 12/05/12  Yes Elvina SidleLauenstein, Kurt, MD  medroxyPROGESTERone (DEPO-PROVERA) 150 MG/ML injection Inject 150 mg into the muscle every 3 (three) months.   Yes [provider]  butalbital-acetaminophen-caffeine (FIORICET, ESGIC) 50-325-40 MG tablet Take 1-2 tablets by mouth every 6 (six) hours as needed for headache. Patient not taking: Reported on 07/05/2018 01/01/18 01/01/19  Georgina QuintSagardia, Lemoyne Nestor Jose, MD  fluticasone [facility](FLONASE) 50 MCG/ACT nasal spray Use 2 sprays each nostril twice daily for 3 days, then continue 2 sprays once daily Patient not taking: Reported on 11/25/2017 05/12/16   Guinevere ScarletWilliams, Blake, MD    Allergies  Allergen Reactions  . Eggs Or Egg-Derived Products   . Peanuts [Peanut Oil]   . Penicillins Rash    Patient Active Problem List   Diagnosis Date Noted  . Cough 02/12/2018  . Lower respiratory infection 02/12/2018  . Sinus congestion 02/12/2018  . Acute intractable headache 01/01/2018  . Intractable vomiting with nausea 11/25/2017  . Diarrhea of presumed infectious origin 11/25/2017  . Acute gastroenteritis 11/25/2017    Past Medical History:  Diagnosis Date  . Asthma     No past surgical history on file.  Social History   Socioeconomic History  . Marital status: Single    Spouse name: Not on file  . Number of children: Not on file  . Years of education: Not on file  . Highest education level: Not on file  Occupational History  . Not on file    Social Needs  . Financial resource strain: Not on file  . Food insecurity:    Worry: Not on file    Inability: Not on file  . Transportation needs:    Medical: Not on file    Non-medical: Not on file  Tobacco Use  . Smoking status: Never Smoker  . Smokeless tobacco: Never Used  Substance and Sexual Activity  . Alcohol use: No    Alcohol/week: 0.0 standard drinks  . Drug use: No  . Sexual activity: Never    Birth control/protection: Abstinence  Lifestyle  . Physical activity:    Days per week: Not on file    Minutes per session: Not on file  . Stress: Not on file  Relationships  . Social connections:    Talks on phone: Not on file    Gets together: Not on file    Attends religious service: Not on file    Active member of club or organization: Not on file    Attends meetings of clubs or organizations: Not on file    Relationship status: Not on file  . Intimate partner violence:    Fear of current or ex partner: Not on file    Emotionally abused: Not on file    Physically abused: Not on file    Forced sexual activity: Not on file  Other Topics Concern  . Not on file  Social History Narrative  . Not on file    Family History  Problem Relation Age of Onset  . Hyperlipidemia Mother   . Hypertension Mother   . Hyperlipidemia Maternal Grandmother   . Hypertension Maternal Grandmother      Review of Systems  Constitutional: Negative for fever.  HENT: Negative.  Negative for congestion and sore throat.   Eyes: Positive for discharge and redness. Negative for blurred vision, double vision and pain.  Respiratory: Negative.  Negative for cough and shortness of breath.   Cardiovascular: Negative for chest pain and palpitations.  Gastrointestinal: Negative for nausea and vomiting.  Skin: Negative.  Negative for rash.  Neurological: Negative for dizziness and headaches.  All other systems reviewed and are negative.   Vitals:   07/05/18 1147  BP: 120/72  Pulse: 71   Resp: 16  Temp: 99.3 F (37.4 C)  SpO2: 100%    Physical Exam  Constitutional: She is oriented to person, place, and time. She appears well-developed and well-nourished.  HENT:  Head: Normocephalic and atraumatic.  Mouth/Throat: Oropharynx is clear and moist.  Eyes: Pupils are equal, round, and reactive to light. EOM and lids are normal. Right conjunctiva is not injected. Right conjunctiva has no hemorrhage. Left conjunctiva is injected. Left conjunctiva has no hemorrhage.  Neck: Normal range of motion. Neck supple.  Cardiovascular: Normal rate.  Pulmonary/Chest: Effort normal.  Musculoskeletal: Normal range of motion.  Neurological: She is alert and oriented to person, place, and time.  Skin: Skin is warm and dry. Capillary refill takes less than 2 seconds.  Psychiatric: She has a normal mood and affect. Her behavior is normal.  Vitals reviewed.    ASSESSMENT & PLAN: Diagnoses and all orders for this visit:  Acute bacterial conjunctivitis of left eye -     trimethoprim-polymyxin b (POLYTRIM) ophthalmic solution; Place 2 drops into the left eye every 6 (six) hours for 5 days.    Patient Instructions       IF you received an x-ray today, you will receive an invoice from [facility]. Please contact [facility] at [phone] with questions or concerns regarding your invoice.   IF you received labwork today, you will receive an invoice from [facility]. Please contact [facility] at [phone] with questions or concerns regarding your invoice.   Our billing staff will not be able to assist you with questions regarding bills from these companies.  You will be contacted with the lab results as soon as they are available. The fastest way to get your results is to activate your My Chart account. Instructions are located on the last page of this paperwork. If you have not heard from [place] regarding the results in 2 weeks, please contact this office.     Bacterial  Conjunctivitis Bacterial conjunctivitis is an infection of your conjunctiva. This is the clear membrane that covers the white part of your eye and the inner surface of your eyelid. This condition can make your eye:  Red or pink.  Itchy.  This condition is caused by bacteria. This condition spreads very easily from person to person (is contagious) and from one eye to the other eye. Follow these instructions at home: Medicines  Take or apply your antibiotic medicine as told by your doctor. Do not stop taking or applying the antibiotic even if you start to feel better.  Take or apply over-the-counter and prescription medicines only as told by your doctor.  Do not touch your eyelid with the eye drop bottle or the ointment tube. Managing discomfort  Wipe any fluid from your eye with  a warm, wet washcloth or a cotton ball.  Place a cool, clean washcloth on your eye. Do this for 10-20 minutes, 3-4 times per day. General instructions  Do not wear contact lenses until the irritation is gone. Wear glasses until your doctor says it is okay to wear contacts.  Do not wear eye makeup until your symptoms are gone. Throw away any old makeup.  Change or wash your pillowcase every day.  Do not share towels or washcloths with anyone.  Wash your hands often with soap and water. Use paper towels to dry your hands.  Do not touch or rub your eyes.  Do not drive or use heavy machinery if your vision is blurry. Contact a doctor if:  You have a fever.  Your symptoms do not get better after 10 days. Get help right away if:  You have a fever and your symptoms suddenly get worse.  You have very bad pain when you move your eye.  Your face: ? Hurts. ? Is red. ? Is swollen.  You have sudden loss of vision. This information is not intended to replace advice given to you by your health care provider. Make sure you discuss any questions you have with your health care provider. Document Released:  08/19/2008 Document Revised: 04/17/2016 Document Reviewed: 08/23/2015 Elsevier Interactive Patient Education  2018 Elsevier Inc.      Edwina BarthMiguel Qamar Aughenbaugh, MD [facility]

## 2019-09-21 ENCOUNTER — Telehealth (INDEPENDENT_AMBULATORY_CARE_PROVIDER_SITE_OTHER): Payer: BC Managed Care – PPO | Admitting: Registered Nurse

## 2019-09-21 ENCOUNTER — Other Ambulatory Visit: Payer: Self-pay

## 2019-09-21 ENCOUNTER — Encounter: Payer: Self-pay | Admitting: Registered Nurse

## 2019-09-21 ENCOUNTER — Telehealth: Payer: Self-pay | Admitting: Registered Nurse

## 2019-09-21 VITALS — Ht 60.0 in | Wt 155.0 lb

## 2019-09-21 DIAGNOSIS — R197 Diarrhea, unspecified: Secondary | ICD-10-CM

## 2019-09-21 NOTE — Telephone Encounter (Signed)
Pt was seen today 09/21/19. She needs a RTW note faxed to [phone]. She says she talked about this with Orland Mustard.

## 2019-09-21 NOTE — Progress Notes (Signed)
Diarrhea starting yesterday. Stomach upset and gone all day and still going some today.   It hurts when she has to pass gas. Started yesterday.   Work excuse for return to work when better.   If note generated. Will fax to [employer]. Once received the fax number.

## 2019-09-21 NOTE — Progress Notes (Signed)
Telemedicine Encounter- SOAP NOTE Established Patient  This telephone encounter was conducted with the patient's (or proxy's) verbal consent via audio telecommunications: yes  Patient was instructed to have this encounter in a suitably private space; and to only have persons present to whom they give permission to participate. In addition, patient identity was confirmed by use of name plus two identifiers (DOB and address).  I discussed the limitations, risks, security and privacy concerns of performing an evaluation and management service by telephone and the availability of in person appointments. I also discussed with the patient that there may be a patient responsible charge related to this service. The patient expressed understanding and agreed to proceed.  I spent a total of 13 minutes talking with the patient or their proxy.  No chief complaint on file.   Subjective   Deanna Patterson is a 23 y.o. established patient. Telephone visit today for ongoing diarrhea  HPI Pt states symptoms started yesterday with upset stomach. Notes that diarrhea onset soon after and was extremely frequent yesterday. She notes that while it is somewhat better today, it's still very much present. She states that she has not taken anything for this. 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 No known cases of COVID at home or work Denies nausea, vomiting, shob, chest pain, sensory changes, cough.  Patient Active Problem List   Diagnosis Date Noted  . Acute bacterial conjunctivitis of left eye 07/05/2018  . Cough 02/12/2018  . Lower respiratory infection 02/12/2018  . Sinus congestion 02/12/2018  . Acute intractable headache 01/01/2018  . Intractable vomiting with nausea 11/25/2017  . Diarrhea of presumed infectious origin 11/25/2017  . Acute gastroenteritis 11/25/2017    Past Medical History:  Diagnosis Date  . Asthma     Current Outpatient Medications  Medication Sig  Dispense Refill  . medroxyPROGESTERone (DEPO-PROVERA) 150 MG/ML injection Inject 150 mg into the muscle every 3 (three) months.    Marland Kitchen albuterol (PROVENTIL HFA;VENTOLIN HFA) 108 (90 BASE) MCG/ACT inhaler Inhale 2 puffs into the lungs every 6 (six) hours as needed for wheezing. (Patient not taking: Reported on 09/21/2019) 1 Inhaler 10   No current facility-administered medications for this visit.     Allergies  Allergen Reactions  . Eggs Or Egg-Derived Products   . Peanuts [Peanut Oil]   . Penicillins Rash    Social History   Socioeconomic History  . Marital status: Single    Spouse name: Not on file  . Number of children: Not on file  . Years of education: Not on file  . Highest education level: Not on file  Occupational History  . Not on file  Social Needs  . Financial resource strain: Not on file  . Food insecurity    Worry: Not on file    Inability: Not on file  . Transportation needs    Medical: Not on file    Non-medical: Not on file  Tobacco Use  . Smoking status: Never Smoker  . Smokeless tobacco: Never Used  Substance and Sexual Activity  . Alcohol use: No    Alcohol/week: 0.0 standard drinks  . Drug use: No  . Sexual activity: Never    Birth control/protection: Abstinence  Lifestyle  . Physical activity    Days per week: Not on file    Minutes per session: Not on file  . Stress: Not on file  Relationships  . Social connections    Talks on phone: Not on file  Gets together: Not on file    Attends religious service: Not on file    Active member of club or organization: Not on file    Attends meetings of clubs or organizations: Not on file    Relationship status: Not on file  . Intimate partner violence    Fear of current or ex partner: Not on file    Emotionally abused: Not on file    Physically abused: Not on file    Forced sexual activity: Not on file  Other Topics Concern  . Not on file  Social History Narrative  . Not on file    ROS Per hpi   Objective   Vitals as reported by the patient: Today's Vitals   09/21/19 0850  Weight: 155 lb (70.3 kg)  Height: 5' (1.524 m)    Diagnoses and all orders for this visit:  Diarrhea of presumed infectious origin    PLAN  Suggest patient use OTCs like imodium for diarrhea if desired, though that this likely seems like it is infectious, as such, it will likely resolve before treatment is needed  Suggest hydration, well rounded diet, and rest  COVID drive thru testing suggested and work note posted to patient chart. Not to return to work until symptoms resolve and test results return negative  Patient encouraged to call clinic with any questions, comments, or concerns.   I discussed the assessment and treatment plan with the patient. The patient was provided an opportunity to ask questions and all were answered. The patient agreed with the plan and demonstrated an understanding of the instructions.   The patient was advised to call back or seek an in-person evaluation if the symptoms worsen or if the condition fails to improve as anticipated.  I provided 13 minutes of non-face-to-face time during this encounter.  Maximiano Coss, NP  [facility] at [facility]

## 2019-09-21 NOTE — Patient Instructions (Signed)
° ° ° °  If you have lab work done today you will be contacted with your lab results within the next 2 weeks.  If you have not heard from us then please contact us. The fastest way to get your results is to register for My Chart. ° ° °IF you received an x-ray today, you will receive an invoice from [facility]. Please contact [facility] at [phone] with questions or concerns regarding your invoice.  ° °IF you received labwork today, you will receive an invoice from [facility]. Please contact [facility] at [phone] with questions or concerns regarding your invoice.  ° °Our billing staff will not be able to assist you with questions regarding bills from these companies. ° °You will be contacted with the lab results as soon as they are available. The fastest way to get your results is to activate your My Chart account. Instructions are located on the last page of this paperwork. If you have not heard from us regarding the results in 2 weeks, please contact this office. °  ° ° ° °

## 2019-10-01 ENCOUNTER — Other Ambulatory Visit: Payer: Self-pay

## 2019-10-01 DIAGNOSIS — Z20822 Contact with and (suspected) exposure to covid-19: Secondary | ICD-10-CM

## 2019-10-03 LAB — NOVEL CORONAVIRUS, NAA: SARS-CoV-2, NAA: NOT DETECTED

## 2019-12-03 ENCOUNTER — Encounter (HOSPITAL_COMMUNITY): Payer: Self-pay

## 2019-12-03 ENCOUNTER — Other Ambulatory Visit: Payer: Self-pay

## 2019-12-03 ENCOUNTER — Ambulatory Visit (HOSPITAL_COMMUNITY)
Admission: EM | Admit: 2019-12-03 | Discharge: 2019-12-03 | Disposition: A | Discharge status: Home / Self Care | Payer: BC Managed Care – PPO | Attending: Emergency Medicine | Admitting: Emergency Medicine

## 2019-12-03 ENCOUNTER — Ambulatory Visit (INDEPENDENT_AMBULATORY_CARE_PROVIDER_SITE_OTHER): Payer: BC Managed Care – PPO

## 2019-12-03 DIAGNOSIS — M7062 Trochanteric bursitis, left hip: Secondary | ICD-10-CM

## 2019-12-03 DIAGNOSIS — M25552 Pain in left hip: Secondary | ICD-10-CM

## 2019-12-03 DIAGNOSIS — M25559 Pain in unspecified hip: Secondary | ICD-10-CM

## 2019-12-03 MED ORDER — NAPROXEN 500 MG PO TABS: 500.0000 mg | ORAL_TABLET | Freq: Two times a day (BID) | ORAL | 0 refills | Status: DC

## 2019-12-03 NOTE — ED Provider Notes (Signed)
HPI  SUBJECTIVE:  Deanna Patterson is a 24 y.o. female who presents with 3 months of constant, daily throbbing lateral left hip pain starting several days after working out.  States that she was doing an exercise that involved flexion and abduction of the hip.  No pain in her back or  leg.  It becomes sharp when she goes from sitting to standing.  No numbness tingling distally, leg weakness, fevers, body aches, trauma.  No alleviating factors.  She has not tried anything for this.  Symptoms are worse with sitting for long periods of time and then getting up.  Past medical history negative for diabetes, left hip injury.  LMP: On Depo.  Denies the possibility being pregnant.  PMD: [facility].   Past Medical History:  Diagnosis Date  . Asthma     History reviewed. No pertinent surgical history.  Family History  Problem Relation Age of Onset  . Hyperlipidemia Mother   . Hypertension Mother   . Hyperlipidemia Maternal Grandmother   . Hypertension Maternal Grandmother     Social History   Tobacco Use  . Smoking status: Never Smoker  . Smokeless tobacco: Never Used  Substance Use Topics  . Alcohol use: No    Alcohol/week: 0.0 standard drinks  . Drug use: No    No current facility-administered medications for this encounter.  Current Outpatient Medications:  .  albuterol (PROVENTIL HFA;VENTOLIN HFA) 108 (90 BASE) MCG/ACT inhaler, Inhale 2 puffs into the lungs every 6 (six) hours as needed for wheezing. (Patient not taking: Reported on 09/21/2019), Disp: 1 Inhaler, Rfl: 10 .  medroxyPROGESTERone (DEPO-PROVERA) 150 MG/ML injection, Inject 150 mg into the muscle every 3 (three) months., Disp: , Rfl:  .  naproxen (NAPROSYN) 500 MG tablet, Take 1 tablet (500 mg total) by mouth 2 (two) times daily., Disp: 20 tablet, Rfl: 0  Allergies  Allergen Reactions  . Eggs Or Egg-Derived Products   . Peanuts [Peanut Oil]   . Penicillins Rash     ROS  As noted in HPI.   Physical  Exam  BP 122/75 (BP Location: Right Arm)   Pulse 76   Temp 98.9 F (37.2 C) (Oral)   Resp 16   SpO2 100%   Constitutional: Well developed, well nourished, no acute distress Eyes:  EOMI, conjunctiva normal bilaterally HENT: Normocephalic, atraumatic,mucus membranes moist Respiratory: Normal inspiratory effort Cardiovascular: Normal rate GI: nondistended skin: No rash, skin intact Musculoskeletal: Left hip: Tenderness over the trochanteric bursa.  No tenderness over gluteal muscles, down IT band, quadriceps.  No tenderness along the L-spine, SI joint.  No pain with active abduction/adduction of leg. No pain with int/ext rotation hip. No tenderness at sciatic notch. flexion/extension knee WNL. Knee joint NT, stable. Motor strength flexion/ext hip 5/5. Sensation to LT intact. PT 2+ gait normal Neurologic: Alert & oriented x 3, no focal neuro deficits Psychiatric: Speech and behavior appropriate   ED Course   Medications - No data to display  Orders Placed This Encounter  Procedures  . DG Hip Unilat With Pelvis 2-3 Views Left    Standing Status:   Standing    Number of Occurrences:   1    Order Specific Question:   Symptom/Reason for Exam    Answer:   Hip pain [[id number]]    Order Specific Question:   Radiology Contrast Protocol - do NOT remove file path    Answer:   [email or web address]    No results found for this or any previous visit (  from the past 24 hour(s)). DG Hip Unilat With Pelvis 2-3 Views Left  Result Date: 12/03/2019 CLINICAL DATA:  Three-month history of hip pain EXAM: DG HIP (WITH OR WITHOUT PELVIS) 2-3V LEFT COMPARISON:  None. FINDINGS: Frontal pelvis as well as frontal and lateral left hip joint images obtained. No fracture or dislocation. Joint spaces appear normal. No erosive change. IMPRESSION: No fracture or dislocation.  No evident arthropathy. Electronically Signed   By: Lowella Grip III M.D.   On: 12/03/2019 11:32     ED Clinical Impression  1. Trochanteric bursitis of left hip   2. Hip pain      ED Assessment/Plan  X-ray hip given duration of symptoms.  Reviewed imaging independently.  Normal hip and pelvis.  See radiology report for full details.  Patient with a left-sided trochanteric bursitis.  Doubt septic bursitis given duration of symptoms.  Home with NSAIDs.  Follow-up with Dr. Marlou Sa, orthopedics on call for injection of bursa.  Discussed  imaging, MDM, treatment plan, and plan for follow-up with patient. . patient agrees with plan.   Meds ordered this encounter  Medications  . naproxen (NAPROSYN) 500 MG tablet    Sig: Take 1 tablet (500 mg total) by mouth 2 (two) times daily.    Dispense:  20 tablet    Refill:  0    *This clinic note was created using [employer]. Therefore, there may be occasional mistakes despite careful proofreading.   ?    Melynda Ripple, MD 12/03/19 1158

## 2019-12-03 NOTE — ED Triage Notes (Signed)
Pt presents to UC with left sided hip pain x 3 months aprox. Pt states she is here today because her husband told her she needs to be seeing. Pain worse when she walks and elevates her left leg and when sitting down fro extended period of time.

## 2019-12-03 NOTE — Discharge Instructions (Addendum)
I suspect that you have bursitis.  Take 500 mg of Naprosyn with 1 g of Tylenol twice a day as needed for pain.  The next step is to have this injected with local anesthetic and steroids.  Follow-up with orthopedics to have this done.

## 2020-03-22 ENCOUNTER — Encounter: Payer: Self-pay | Admitting: Adult Health Nurse Practitioner

## 2020-03-22 ENCOUNTER — Telehealth (INDEPENDENT_AMBULATORY_CARE_PROVIDER_SITE_OTHER): Payer: BC Managed Care – PPO | Admitting: Adult Health Nurse Practitioner

## 2020-03-22 DIAGNOSIS — G43901 Migraine, unspecified, not intractable, with status migrainosus: Secondary | ICD-10-CM

## 2020-03-23 ENCOUNTER — Telehealth: Payer: Self-pay | Admitting: Adult Health Nurse Practitioner

## 2020-03-23 ENCOUNTER — Ambulatory Visit: Payer: Self-pay | Admitting: Adult Health Nurse Practitioner

## 2020-03-23 ENCOUNTER — Telehealth: Payer: BC Managed Care – PPO | Admitting: Adult Health Nurse Practitioner

## 2020-03-23 ENCOUNTER — Other Ambulatory Visit: Payer: Self-pay

## 2020-03-23 VITALS — BP 114/74 | HR 79 | Temp 98.2°F | Ht 60.0 in | Wt 158.8 lb

## 2020-03-23 DIAGNOSIS — R5383 Other fatigue: Secondary | ICD-10-CM | POA: Diagnosis not present

## 2020-03-23 DIAGNOSIS — G43119 Migraine with aura, intractable, without status migrainosus: Secondary | ICD-10-CM | POA: Diagnosis not present

## 2020-03-23 DIAGNOSIS — R519 Headache, unspecified: Secondary | ICD-10-CM

## 2020-03-23 MED ORDER — MAGNESIUM 500 MG PO CAPS: ORAL_CAPSULE | ORAL | 1 refills | Status: DC

## 2020-03-23 MED ORDER — GABAPENTIN 100 MG PO CAPS: 100.0000 mg | ORAL_CAPSULE | Freq: Three times a day (TID) | ORAL | 3 refills | Status: DC

## 2020-03-23 MED ORDER — SUMATRIPTAN SUCCINATE 25 MG PO TABS: 25.0000 mg | ORAL_TABLET | ORAL | 0 refills | Status: DC | PRN

## 2020-03-23 MED ORDER — ONDANSETRON HCL 4 MG PO TABS: 4.0000 mg | ORAL_TABLET | Freq: Three times a day (TID) | ORAL | 0 refills | Status: DC | PRN

## 2020-03-23 NOTE — Patient Instructions (Addendum)
mAG oXIDE 500MG  daily Gabapentin 100mg  qhs  May increase Gabapentin by 1 capsule until reach 30 min before bed             Migraine Headache A migraine headache is an intense, throbbing pain on one side or both sides of the head. Migraine headaches may also cause other symptoms, such as nausea, vomiting, and sensitivity to light and noise. A migraine headache can last from 4 hours to 3 days. Talk with your doctor about what things may bring on (trigger) your migraine headaches. What are the causes? The exact cause of this condition is not known. However, a migraine may be caused when nerves in the brain become irritated and release chemicals that cause inflammation of blood vessels. This inflammation causes pain. This condition may be triggered or caused by:  Drinking alcohol.  Smoking.  Taking medicines, such as: ? Medicine used to treat chest pain (nitroglycerin). ? Birth control pills. ? Estrogen. ? Certain blood pressure medicines.  Eating or drinking products that contain nitrates, glutamate, aspartame, or tyramine. Aged cheeses, chocolate, or caffeine may also be triggers.  Doing physical activity. Other things that may trigger a migraine headache include:  Menstruation.  Pregnancy.  Hunger.  Stress.  Lack of sleep or too much sleep.  Weather changes.  Fatigue. What increases the risk? The following factors may make you more likely to experience migraine headaches:  Being a certain age. This condition is more common in people who are 46-17 years old.  Being female.  Having a family history of migraine headaches.  Being Caucasian.  Having a mental health condition, such as depression or anxiety.  Being obese. What are the signs or symptoms? The main symptom of this condition is pulsating or throbbing pain. This pain may:  Happen in any area of the head, such as on one side or both sides.  Interfere with daily activities.  Get worse with  physical activity.  Get worse with exposure to bright lights or loud noises. Other symptoms may include:  Nausea.  Vomiting.  Dizziness.  General sensitivity to bright lights, loud noises, or smells. Before you get a migraine headache, you may get warning signs (an aura). An aura may include:  Seeing flashing lights or having blind spots.  Seeing bright spots, halos, or zigzag lines.  Having tunnel vision or blurred vision.  Having numbness or a tingling feeling.  Having trouble talking.  Having muscle weakness. Some people have symptoms after a migraine headache (postdromal phase), such as:  Feeling tired.  Difficulty concentrating. How is this diagnosed? A migraine headache can be diagnosed based on:  Your symptoms.  A physical exam.  Tests, such as: ? CT scan or an MRI of the head. These imaging tests can help rule out other causes of headaches. ? Taking fluid from the spine (lumbar puncture) and analyzing it (cerebrospinal fluid analysis, or CSF analysis). How is this treated? This condition may be treated with medicines that:  Relieve pain.  Relieve nausea.  Prevent migraine headaches. Treatment for this condition may also include:  Acupuncture.  Lifestyle changes like avoiding foods that trigger migraine headaches.  Biofeedback.  Cognitive behavioral therapy. Follow these instructions at home: Medicines  Take over-the-counter and prescription medicines only as told by your health care provider.  Ask your health care provider if the medicine prescribed to you: ? Requires you to avoid driving or using heavy machinery. ? Can cause constipation. You may need to take these actions to prevent or  treat constipation:  Drink enough fluid to keep your urine pale yellow.  Take over-the-counter or prescription medicines.  Eat foods that are high in fiber, such as beans, whole grains, and fresh fruits and vegetables.  Limit foods that are high in fat  and processed sugars, such as fried or sweet foods. Lifestyle  Do not drink alcohol.  Do not use any products that contain nicotine or tobacco, such as cigarettes, e-cigarettes, and chewing tobacco. If you need help quitting, ask your health care provider.  Get at least 8 hours of sleep every night.  Find ways to manage stress, such as meditation, deep breathing, or yoga. General instructions      Keep a journal to find out what may trigger your migraine headaches. For example, write down: ? What you eat and drink. ? How much sleep you get. ? Any change to your diet or medicines.  If you have a migraine headache: ? Avoid things that make your symptoms worse, such as bright lights. ? It may help to lie down in a dark, quiet room. ? Do not drive or use heavy machinery. ? Ask your health care provider what activities are safe for you while you are experiencing symptoms.  Keep all follow-up visits as told by your health care provider. This is important. Contact a health care provider if:  You develop symptoms that are different or more severe than your usual migraine headache symptoms.  You have more than 15 headache days in one month. Get help right away if:  Your migraine headache becomes severe.  Your migraine headache lasts longer than 72 hours.  You have a fever.  You have a stiff neck.  You have vision loss.  Your muscles feel weak or like you cannot control them.  You start to lose your balance often.  You have trouble walking.  You faint.  You have a seizure. Summary  A migraine headache is an intense, throbbing pain on one side or both sides of the head. Migraines may also cause other symptoms, such as nausea, vomiting, and sensitivity to light and noise.  This condition may be treated with medicines and lifestyle changes. You may also need to avoid certain things that trigger a migraine headache.  Keep a journal to find out what may trigger your  migraine headaches.  Contact your health care provider if you have more than 15 headache days in a month or you develop symptoms that are different or more severe than your usual migraine headache symptoms. This information is not intended to replace advice given to you by your health care provider. Make sure you discuss any questions you have with your health care provider. Document Revised: 03/04/2019 Document Reviewed: 12/23/2018 Elsevier Patient Education  2020 [employer].

## 2020-03-23 NOTE — Progress Notes (Signed)
Last 2 months dizzy spells with headache,    Chief Complaint  Patient presents with  . Headache    ongoing for a few weeks. Throbing in front of forehead    HPI  Patient presents with ongoing headache for 3 weeks.  She has had this before but it has started and now associated with some dizziness if she stands up too quickly.  Headaches are frontal.  Bilateral.  No nausea.  No no vomiting.  I saw her yesterday as a telephone visit and asked her to come in to be evaluated in person.  She has been using Goody's powders 2-3 a day for her headache.  We discussed the amount of aspirin that is in good reason why that can be dangerous.  Sleep has been poor for the past year with the patient describing no problem going to sleep but awakening early in the a.m. around 3 or 4 a.m and not being able to get back to sleep.  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  Has not been exercising.      Problem List    Problem List: 2019-08: Acute bacterial conjunctivitis of left eye 2019-03: Cough 2019-03: Lower respiratory infection 2019-03: Sinus congestion 2019-02: Acute intractable headache 2019-01: Intractable vomiting with nausea 2019-01: Diarrhea of presumed infectious origin 2019-01: Acute gastroenteritis   Allergies   is allergic to eggs or egg-derived products; peanuts [peanut oil]; and penicillins.  Medications    Current Outpatient Medications:  .  gabapentin (NEURONTIN) 100 MG capsule, Take 1 capsule (100 mg total) by mouth 3 (three) times daily., Disp: 30 capsule, Rfl: 3 .  Magnesium 500 MG CAPS, 582m daily for migraine prevention, Disp: 30 capsule, Rfl: 1 .  ondansetron (ZOFRAN) 4 MG tablet, Take 1 tablet (4 mg total) by mouth every 8 (eight) hours as needed for nausea or vomiting., Disp: 20 tablet, Rfl: 0 .  SUMAtriptan (IMITREX) 25 MG tablet, Take 1 tablet (25 mg total) by mouth every 2 (two) hours as needed for migraine. May repeat in 2 hours if headache persists or  recurs., Disp: 10 tablet, Rfl: 0   Review of Systems    Constitutional: Negative for activity change, appetite change, chills and fever.  HENT: Negative for congestion, nosebleeds, trouble swallowing and voice change.   Respiratory: Negative for cough, shortness of breath and wheezing.   Cardiac:  Negative for chest pain, pressure, syncope  Gastrointestinal: Negative for diarrhea, nausea and vomiting.  Genitourinary: Negative for difficulty urinating, dysuria, flank pain and hematuria.  Musculoskeletal: Negative for back pain, joint swelling and neck pain.  Neurological: Negative for dizziness, speech difficulty, light-headedness and numbness.  See HPI. All other review of systems negative.     Physical Exam:    height is 5' (1.524 m) and weight is 158 lb 12.8 oz (72 kg). Her temporal temperature is 98.2 F (36.8 C). Her blood pressure is 114/74 and her pulse is 79. Her oxygen saturation is 98%.   Physical Examination: General appearance - alert, well appearing, and in no distress and oriented to person, place, and time Mental status - normal mood, behavior, speech, dress, motor activity, and thought processes Eyes - PERRL. Extraocular movements intact.  No nystagmus.  Neck - supple, no significant adenopathy, carotids upstroke normal bilaterally, no bruits, thyroid exam: thyroid is normal in size without nodules or tenderness Chest - clear to auscultation, no wheezes, rales or rhonchi, symmetric air entry  Heart - normal rate, regular rhythm, normal  S1, S2, no murmurs, rubs, clicks or gallops Extremities - dependent LE edema without clubbing or cyanosis Skin - normal coloration and turgor, no rashes, no suspicious skin lesions noted  No hyperpigmentation of skin.  No current hematomas noted  Neurological exam reveals alert, oriented, normal speech, no focal findings or movement disorder noted, screening mental status exam normal, neck supple without rigidity, cranial nerves II through  XII intact, motor and sensory grossly normal bilaterally, normal muscle tone, no tremors, strength 5/5, Romberg sign negative, normal gait and station.   Lab /Imaging Review    no lab studies available for review at time of visit.   Assessment & Plan:  Deanna Patterson is a 24 y.o. female    1. Acute intractable headache, unspecified headache type   2. Other fatigue   3. Intractable migraine with aura without status migrainosus    Orders Placed This Encounter  Procedures  . CBC with Differential/Platelet  . CMP14+EGFR  . VITAMIN D 25 Hydroxy (Vit-D Deficiency, Fractures)  . Iron, TIBC and Ferritin Panel  . B12 and Folate Panel  . Thyroid Panel With TSH   Meds ordered this encounter  Medications  . gabapentin (NEURONTIN) 100 MG capsule    Sig: Take 1 capsule (100 mg total) by mouth 3 (three) times daily.    Dispense:  30 capsule    Refill:  3  . Magnesium 500 MG CAPS    Sig: 532m daily for migraine prevention    Dispense:  30 capsule    Refill:  1  . SUMAtriptan (IMITREX) 25 MG tablet    Sig: Take 1 tablet (25 mg total) by mouth every 2 (two) hours as needed for migraine. May repeat in 2 hours if headache persists or recurs.    Dispense:  10 tablet    Refill:  0  . ondansetron (ZOFRAN) 4 MG tablet    Sig: Take 1 tablet (4 mg total) by mouth every 8 (eight) hours as needed for nausea or vomiting.    Dispense:  20 tablet    Refill:  0   Patient is having a mixed constellation of symptoms from fatigue and insomnia to intractable headache x3 weeks.  Encouraged to drink more water.  Add magnesium daily to diet to help for preventative therapy for headaches.  We will try sumatriptan for abortive therapy.  Instructed on how to use and the need to limit the amount of time she can take it in a month.  We will have her follow-up in 2 to 4 weeks to determine if any of this is effective.  She is amenable to ordering baseline labs and screening for iron deficiency anemia given her  fatigue and dizziness.  Will review labs when she follows up in clinic.  Should she get the worst headache of her life she is to go to the emergency room.  She is in line with this plan.  A total of 48 minutes were spent face-to-face with the patient during this encounter and over half of that time was spent on counseling and coordination of care.   SGlyn Ade NP

## 2020-03-24 LAB — CBC WITH DIFFERENTIAL/PLATELET
Basophils Absolute: 0.1 10*3/uL (ref 0.0–0.2)
Basos: 1 %
EOS (ABSOLUTE): 0.3 10*3/uL (ref 0.0–0.4)
Eos: 4 %
Hematocrit: 39.4 % (ref 34.0–46.6)
Hemoglobin: 13.2 g/dL (ref 11.1–15.9)
Immature Grans (Abs): 0 10*3/uL (ref 0.0–0.1)
Immature Granulocytes: 0 %
Lymphocytes Absolute: 2.3 10*3/uL (ref 0.7–3.1)
Lymphs: 35 %
MCH: 30.9 pg (ref 26.6–33.0)
MCHC: 33.5 g/dL (ref 31.5–35.7)
MCV: 92 fL (ref 79–97)
Monocytes Absolute: 0.5 10*3/uL (ref 0.1–0.9)
Monocytes: 8 %
Neutrophils Absolute: 3.4 10*3/uL (ref 1.4–7.0)
Neutrophils: 52 %
Platelets: 302 10*3/uL (ref 150–450)
RBC: 4.27 x10E6/uL (ref 3.77–5.28)
RDW: 12.5 % (ref 11.7–15.4)
WBC: 6.5 10*3/uL (ref 3.4–10.8)

## 2020-03-24 LAB — IRON,TIBC AND FERRITIN PANEL
Ferritin: 217 ng/mL — ABNORMAL HIGH (ref 15–150)
Iron Saturation: 28 % (ref 15–55)
Iron: 84 ug/dL (ref 27–159)
Total Iron Binding Capacity: 304 ug/dL (ref 250–450)
UIBC: 220 ug/dL (ref 131–425)

## 2020-03-24 LAB — CMP14+EGFR
ALT: 95 IU/L — ABNORMAL HIGH (ref 0–32)
AST: 38 IU/L (ref 0–40)
Albumin/Globulin Ratio: 1.6 (ref 1.2–2.2)
Albumin: 4.5 g/dL (ref 3.9–5.0)
Alkaline Phosphatase: 63 IU/L (ref 39–117)
BUN/Creatinine Ratio: 18 (ref 9–23)
BUN: 14 mg/dL (ref 6–20)
Bilirubin Total: 0.5 mg/dL (ref 0.0–1.2)
CO2: 23 mmol/L (ref 20–29)
Calcium: 10.3 mg/dL — ABNORMAL HIGH (ref 8.7–10.2)
Chloride: 102 mmol/L (ref 96–106)
Creatinine, Ser: 0.78 mg/dL (ref 0.57–1.00)
GFR calc Af Amer: 123 mL/min/{1.73_m2} (ref >59)
GFR calc non Af Amer: 107 mL/min/{1.73_m2} (ref >59)
Globulin, Total: 2.9 g/dL (ref 1.5–4.5)
Glucose: 102 mg/dL — ABNORMAL HIGH (ref 65–99)
Potassium: 4.4 mmol/L (ref 3.5–5.2)
Sodium: 139 mmol/L (ref 134–144)
Total Protein: 7.4 g/dL (ref 6.0–8.5)

## 2020-03-24 LAB — THYROID PANEL WITH TSH
Free Thyroxine Index: 1.2 (ref 1.2–4.9)
T3 Uptake Ratio: 24 % (ref 24–39)
T4, Total: 5.1 ug/dL (ref 4.5–12.0)
TSH: 1.47 u[IU]/mL (ref 0.450–4.500)

## 2020-03-24 LAB — B12 AND FOLATE PANEL
Folate: 13.8 ng/mL (ref >3.0)
Vitamin B-12: 313 pg/mL (ref 232–1245)

## 2020-03-24 LAB — VITAMIN D 25 HYDROXY (VIT D DEFICIENCY, FRACTURES): Vit D, 25-Hydroxy: 13.7 ng/mL — ABNORMAL LOW (ref 30.0–100.0)

## 2020-03-29 ENCOUNTER — Encounter: Payer: Self-pay | Admitting: Adult Health Nurse Practitioner

## 2020-03-29 ENCOUNTER — Other Ambulatory Visit: Payer: Self-pay | Admitting: Adult Health Nurse Practitioner

## 2020-03-29 DIAGNOSIS — G43119 Migraine with aura, intractable, without status migrainosus: Secondary | ICD-10-CM | POA: Insufficient documentation

## 2020-03-29 DIAGNOSIS — R5383 Other fatigue: Secondary | ICD-10-CM | POA: Insufficient documentation

## 2020-03-29 DIAGNOSIS — E559 Vitamin D deficiency, unspecified: Secondary | ICD-10-CM

## 2020-04-02 ENCOUNTER — Other Ambulatory Visit: Payer: Self-pay | Admitting: Adult Health Nurse Practitioner

## 2020-04-02 DIAGNOSIS — G43901 Migraine, unspecified, not intractable, with status migrainosus: Secondary | ICD-10-CM | POA: Insufficient documentation

## 2020-04-02 MED ORDER — VITAMIN D (ERGOCALCIFEROL) 1.25 MG (50000 UNIT) PO CAPS: 50000.0000 [IU] | ORAL_CAPSULE | ORAL | 3 refills | Status: AC

## 2020-04-02 NOTE — Progress Notes (Signed)
Virtual Visit via Video Note  I connected with Staci Acosta on 04/02/20 at  3:50 PM EDT by a video enabled telemedicine application and verified that I am speaking with the correct person using two identifiers.  Location: Patient:home  Provider:[facility]   I discussed the limitations of evaluation and management by telemedicine and the availability of in person appointments. The patient expressed understanding and agreed to proceed.  History of Present Illness: Patient presents with headaches, now with a headache that has been intermittent for 3 weeks with the last headache still going on for 3 days.  She has been taking Goody's powders approximately 1-2 a day if her headache is not relieved by Motrin or ibuprofen.  She has had some associated dizziness with the headaches.  In addition, occasional nausea.  The headaches have been so difficult that she has had to miss work for them.  She has been off of Depo since November and still not had a period.  So, is unsure whether her headaches are coinciding with any menstrual symptoms.  Denies any neurologic abnormalities.  No weakness, paresthesias, or difficulty with balance.   Observations/Objective:  General appearance: alert, well appearing, and in no distress. Mental Status: normal mood, behavior, speech, dress, motor activity, and thought processes.  Assessment and Plan: 1. Migraine with status migrainosus, not intractable, unspecified migraine type      Follow Up Instructions: I have asked the patient to follow-up with me in clinic tomorrow to better assess her.  I do not think a bone evaluation is appropriate enough to discuss the headache that has changed in form and intensity.  She is amenable to that.  I have gone ahead and put in a referral to neurology so that if she needs that she will have an appointment.  She is in line with this plan   I discussed the assessment and treatment plan with the patient. The patient was provided an  opportunity to ask questions and all were answered. The patient agreed with the plan and demonstrated an understanding of the instructions.   The patient was advised to call back or seek an in-person evaluation if the symptoms worsen or if the condition fails to improve as anticipated.  I provided 18 minutes of non-face-to-face time during this encounter.    Elyse Jarvis, NP

## 2020-04-04 ENCOUNTER — Telehealth: Payer: Self-pay | Admitting: General Practice

## 2020-04-04 NOTE — Telephone Encounter (Signed)
Called pt left a message to [facility] app on Fri 14/21

## 2020-04-06 ENCOUNTER — Ambulatory Visit: Payer: BC Managed Care – PPO | Admitting: Adult Health Nurse Practitioner

## 2020-04-06 ENCOUNTER — Ambulatory Visit: Payer: Self-pay | Admitting: Family Medicine

## 2020-04-09 ENCOUNTER — Encounter: Payer: Self-pay | Admitting: Family Medicine

## 2020-05-07 ENCOUNTER — Ambulatory Visit (INDEPENDENT_AMBULATORY_CARE_PROVIDER_SITE_OTHER): Payer: BC Managed Care – PPO | Admitting: Registered Nurse

## 2020-05-07 ENCOUNTER — Encounter: Payer: Self-pay | Admitting: Registered Nurse

## 2020-05-07 ENCOUNTER — Other Ambulatory Visit: Payer: Self-pay

## 2020-05-07 VITALS — BP 126/77 | HR 85 | Temp 98.1°F | Resp 18 | Ht 60.0 in | Wt 156.0 lb

## 2020-05-07 DIAGNOSIS — Z3490 Encounter for supervision of normal pregnancy, unspecified, unspecified trimester: Secondary | ICD-10-CM

## 2020-05-07 DIAGNOSIS — R7309 Other abnormal glucose: Secondary | ICD-10-CM | POA: Diagnosis not present

## 2020-05-07 DIAGNOSIS — Z3201 Encounter for pregnancy test, result positive: Secondary | ICD-10-CM

## 2020-05-07 DIAGNOSIS — N926 Irregular menstruation, unspecified: Secondary | ICD-10-CM | POA: Diagnosis not present

## 2020-05-07 DIAGNOSIS — G43119 Migraine with aura, intractable, without status migrainosus: Secondary | ICD-10-CM | POA: Diagnosis not present

## 2020-05-07 DIAGNOSIS — E559 Vitamin D deficiency, unspecified: Secondary | ICD-10-CM

## 2020-05-07 LAB — GLUCOSE, POCT (MANUAL RESULT ENTRY): POC Glucose: 116 mg/dl — AB (ref 70–99)

## 2020-05-07 LAB — POCT URINE PREGNANCY: Preg Test, Ur: POSITIVE — AB

## 2020-05-07 MED ORDER — SUMATRIPTAN SUCCINATE 25 MG PO TABS: 25.0000 mg | ORAL_TABLET | ORAL | 3 refills | Status: DC | PRN

## 2020-05-07 NOTE — Patient Instructions (Signed)
° ° ° °  If you have lab work done today you will be contacted with your lab results within the next 2 weeks.  If you have not heard from us then please contact us. The fastest way to get your results is to register for My Chart. ° ° °IF you received an x-ray today, you will receive an invoice from [facility]. Please contact [facility] at [phone] with questions or concerns regarding your invoice.  ° °IF you received labwork today, you will receive an invoice from [facility]. Please contact [facility] at [phone] with questions or concerns regarding your invoice.  ° °Our billing staff will not be able to assist you with questions regarding bills from these companies. ° °You will be contacted with the lab results as soon as they are available. The fastest way to get your results is to activate your My Chart account. Instructions are located on the last page of this paperwork. If you have not heard from us regarding the results in 2 weeks, please contact this office. °  ° ° ° °

## 2020-05-08 LAB — IRON,TIBC AND FERRITIN PANEL
Ferritin: 202 ng/mL — ABNORMAL HIGH (ref 15–150)
Iron Saturation: 37 % (ref 15–55)
Iron: 107 ug/dL (ref 27–159)
Total Iron Binding Capacity: 293 ug/dL (ref 250–450)
UIBC: 186 ug/dL (ref 131–425)

## 2020-05-08 LAB — COMPREHENSIVE METABOLIC PANEL
ALT: 94 IU/L — ABNORMAL HIGH (ref 0–32)
AST: 49 IU/L — ABNORMAL HIGH (ref 0–40)
Albumin/Globulin Ratio: 1.6 (ref 1.2–2.2)
Albumin: 4.6 g/dL (ref 3.9–5.0)
Alkaline Phosphatase: 89 IU/L (ref 48–121)
BUN/Creatinine Ratio: 11 (ref 9–23)
BUN: 9 mg/dL (ref 6–20)
Bilirubin Total: 0.7 mg/dL (ref 0.0–1.2)
CO2: 19 mmol/L — ABNORMAL LOW (ref 20–29)
Calcium: 10.2 mg/dL (ref 8.7–10.2)
Chloride: 103 mmol/L (ref 96–106)
Creatinine, Ser: 0.84 mg/dL (ref 0.57–1.00)
GFR calc Af Amer: 112 mL/min/{1.73_m2} (ref >59)
GFR calc non Af Amer: 97 mL/min/{1.73_m2} (ref >59)
Globulin, Total: 2.8 g/dL (ref 1.5–4.5)
Glucose: 103 mg/dL — ABNORMAL HIGH (ref 65–99)
Potassium: 4.3 mmol/L (ref 3.5–5.2)
Sodium: 139 mmol/L (ref 134–144)
Total Protein: 7.4 g/dL (ref 6.0–8.5)

## 2020-05-08 LAB — VITAMIN D 25 HYDROXY (VIT D DEFICIENCY, FRACTURES): Vit D, 25-Hydroxy: 15.6 ng/mL — ABNORMAL LOW (ref 30.0–100.0)

## 2020-05-16 ENCOUNTER — Encounter: Payer: Self-pay | Admitting: Registered Nurse

## 2020-05-16 NOTE — Progress Notes (Signed)
Established Patient Office Visit  Subjective:  Patient ID: Deanna Patterson, female    DOB: 1996/04/10  Age: 24 y.o. MRN: [id number]  CC:  Chief Complaint  Patient presents with  . Follow-up    discuss Vitamin D and birth control she has been off since nov 2020     HPI Deanna Patterson presents for follow up   Wants to discuss Vit D and BCM.  Formerly on dmpa injection, stopped in late 2020, has not had menses yet. This may be normal with dmpa injection as there are variations in how quickly normal menses and fertility can return. However, pt reports upic and requesting poct urine preg test.   In addition, requesting routine labs including follow up on Vit D  Has had recent migraines - usually are okay, but somewhat worse now and wants to try something different than OTC meds. Has not been on rx for migraines in past.   No other concerns today.  Past Medical History:  Diagnosis Date  . Asthma     No past surgical history on file.  Family History  Problem Relation Age of Onset  . Hyperlipidemia Mother   . Hypertension Mother   . Hyperlipidemia Maternal Grandmother   . Hypertension Maternal Grandmother     Social History   Socioeconomic History  . Marital status: Single    Spouse name: Not on file  . Number of children: Not on file  . Years of education: Not on file  . Highest education level: Not on file  Occupational History  . Not on file  Tobacco Use  . Smoking status: Never Smoker  . Smokeless tobacco: Never Used  Substance and Sexual Activity  . Alcohol use: No    Alcohol/week: 0.0 standard drinks  . Drug use: No  . Sexual activity: Never    Birth control/protection: Abstinence  Other Topics Concern  . Not on file  Social History Narrative  . Not on file   Social Determinants of Health   Financial Resource Strain:   . Difficulty of Paying Living Expenses:   Food Insecurity:   . Worried About Charity fundraiser in the Last Year:   . Academic librarian in the Last Year:   Transportation Needs:   . Film/video editor (Medical):   Marland Kitchen Lack of Transportation (Non-Medical):   Physical Activity:   . Days of Exercise per Week:   . Minutes of Exercise per Session:   Stress:   . Feeling of Stress :   Social Connections:   . Frequency of Communication with Friends and Family:   . Frequency of Social Gatherings with Friends and Family:   . Attends Religious Services:   . Active Member of Clubs or Organizations:   . Attends Archivist Meetings:   Marland Kitchen Marital Status:   Intimate Partner Violence:   . Fear of Current or Ex-Partner:   . Emotionally Abused:   Marland Kitchen Physically Abused:   . Sexually Abused:     Outpatient Medications Prior to Visit  Medication Sig Dispense Refill  . Magnesium 500 MG CAPS 500mg  daily for migraine prevention 30 capsule 1  . ondansetron (ZOFRAN) 4 MG tablet Take 1 tablet (4 mg total) by mouth every 8 (eight) hours as needed for nausea or vomiting. 20 tablet 0  . SUMAtriptan (IMITREX) 25 MG tablet Take 1 tablet (25 mg total) by mouth every 2 (two) hours as needed for migraine. May repeat in 2 hours if headache  persists or recurs. 10 tablet 0  . gabapentin (NEURONTIN) 100 MG capsule Take 1 capsule (100 mg total) by mouth 3 (three) times daily. (Patient not taking: Reported on 05/07/2020) 30 capsule 3   No facility-administered medications prior to visit.    Allergies  Allergen Reactions  . Eggs Or Egg-Derived Products   . Peanuts [Peanut Oil]   . Penicillins Rash    ROS Review of Systems Per hpi     Objective:    Physical Exam Constitutional:      General: She is not in acute distress.    Appearance: Normal appearance. She is not ill-appearing, toxic-appearing or diaphoretic.  Cardiovascular:     Rate and Rhythm: Normal rate and regular rhythm.  Pulmonary:     Effort: Pulmonary effort is normal. No respiratory distress.  Skin:    General: Skin is warm and dry.     Capillary Refill:  Capillary refill takes less than 2 seconds.     Coloration: Skin is not jaundiced or pale.     Findings: No bruising, erythema, lesion or rash.  Neurological:     General: No focal deficit present.     Mental Status: She is alert and oriented to person, place, and time. Mental status is at baseline.  Psychiatric:        Mood and Affect: Mood normal.        Behavior: Behavior normal.        Thought Content: Thought content normal.        Judgment: Judgment normal.     BP 126/77   Pulse 85   Temp 98.1 F (36.7 C) (Temporal)   Resp 18   Ht 5' (1.524 m)   Wt 156 lb (70.8 kg)   SpO2 100%   BMI 30.47 kg/m  Wt Readings from Last 3 Encounters:  05/07/20 156 lb (70.8 kg)  03/23/20 158 lb 12.8 oz (72 kg)  09/21/19 155 lb (70.3 kg)     Health Maintenance Due  Topic Date Due  . PAP SMEAR-Modifier  11/24/2018    There are no preventive care reminders to display for this patient.  Lab Results  Component Value Date   TSH 1.470 03/23/2020   Lab Results  Component Value Date   WBC 6.5 03/23/2020   HGB 13.2 03/23/2020   HCT 39.4 03/23/2020   MCV 92 03/23/2020   PLT 302 03/23/2020   Lab Results  Component Value Date   NA 139 05/07/2020   K 4.3 05/07/2020   CO2 19 (L) 05/07/2020   GLUCOSE 103 (H) 05/07/2020   BUN 9 05/07/2020   CREATININE 0.84 05/07/2020   BILITOT 0.7 05/07/2020   ALKPHOS 89 05/07/2020   AST 49 (H) 05/07/2020   ALT 94 (H) 05/07/2020   PROT 7.4 05/07/2020   ALBUMIN 4.6 05/07/2020   CALCIUM 10.2 05/07/2020   No results found for: CHOL No results found for: HDL No results found for: LDLCALC No results found for: TRIG No results found for: CHOLHDL No results found for: UXLK4M    Assessment & Plan:   Problem List Items Addressed This Visit      Cardiovascular and Mediastinum   Intractable migraine with aura without status migrainosus   Relevant Medications   SUMAtriptan (IMITREX) 25 MG tablet     Other   Vitamin D deficiency   Relevant  Orders   Vitamin D, 25-hydroxy (Completed)    Other Visit Diagnoses    Elevated glucose    -  Primary  Relevant Orders   POCT glucose (manual entry) (Completed)   Missed period       Relevant Orders   POCT urine pregnancy (Completed)   Comprehensive metabolic panel (Completed)   Ambulatory referral to Obstetrics / Gynecology   Iron, TIBC and Ferritin Panel (Completed)   Vitamin D, 25-hydroxy (Completed)   Positive pregnancy test       Relevant Orders   Ambulatory referral to Obstetrics / Gynecology   Pregnancy with fetus of unknown gestational age       Relevant Orders   Ambulatory referral to Obstetrics / Gynecology      Meds ordered this encounter  Medications  . SUMAtriptan (IMITREX) 25 MG tablet    Sig: Take 1 tablet (25 mg total) by mouth every 2 (two) hours as needed for migraine. May repeat in 2 hours if headache persists or recurs.    Dispense:  10 tablet    Refill:  3    Follow-up: No follow-ups on file.   PLAN  Urine POCT preg test positive - pt excited. Looking to get established with obgyn group, will refer  Labs collected, will follow up as warranted  Data suggests sumatriptan is safe during pregnancy but would prefer pt withhold until est with OBGYN  ER precautions reviewed. Discussed that estimated gestational age not possible without normal lmp.  Patient encouraged to call clinic with any questions, comments, or concerns.  Janeece Agee, NP

## 2020-05-24 ENCOUNTER — Inpatient Hospital Stay (HOSPITAL_COMMUNITY): Payer: BC Managed Care – PPO

## 2020-05-24 ENCOUNTER — Other Ambulatory Visit: Payer: Self-pay

## 2020-05-24 ENCOUNTER — Encounter (HOSPITAL_COMMUNITY): Payer: Self-pay | Admitting: Obstetrics and Gynecology

## 2020-05-24 ENCOUNTER — Inpatient Hospital Stay (HOSPITAL_COMMUNITY)
Admission: AD | Admit: 2020-05-24 | Discharge: 2020-05-24 | Disposition: A | Discharge status: Home / Self Care | Payer: BC Managed Care – PPO | Attending: Obstetrics and Gynecology | Admitting: Obstetrics and Gynecology

## 2020-05-24 DIAGNOSIS — Z880 Allergy status to penicillin: Secondary | ICD-10-CM | POA: Diagnosis not present

## 2020-05-24 DIAGNOSIS — Z79899 Other long term (current) drug therapy: Secondary | ICD-10-CM | POA: Insufficient documentation

## 2020-05-24 DIAGNOSIS — O99351 Diseases of the nervous system complicating pregnancy, first trimester: Secondary | ICD-10-CM | POA: Diagnosis not present

## 2020-05-24 DIAGNOSIS — O99891 Other specified diseases and conditions complicating pregnancy: Secondary | ICD-10-CM

## 2020-05-24 DIAGNOSIS — Z3A01 Less than 8 weeks gestation of pregnancy: Secondary | ICD-10-CM

## 2020-05-24 DIAGNOSIS — Z8249 Family history of ischemic heart disease and other diseases of the circulatory system: Secondary | ICD-10-CM | POA: Diagnosis not present

## 2020-05-24 DIAGNOSIS — O209 Hemorrhage in early pregnancy, unspecified: Secondary | ICD-10-CM | POA: Diagnosis not present

## 2020-05-24 DIAGNOSIS — N898 Other specified noninflammatory disorders of vagina: Secondary | ICD-10-CM | POA: Diagnosis present

## 2020-05-24 DIAGNOSIS — G43901 Migraine, unspecified, not intractable, with status migrainosus: Secondary | ICD-10-CM

## 2020-05-24 DIAGNOSIS — Z6790 Unspecified blood type, Rh positive: Secondary | ICD-10-CM | POA: Diagnosis not present

## 2020-05-24 DIAGNOSIS — O26891 Other specified pregnancy related conditions, first trimester: Secondary | ICD-10-CM | POA: Diagnosis not present

## 2020-05-24 DIAGNOSIS — O4690 Antepartum hemorrhage, unspecified, unspecified trimester: Secondary | ICD-10-CM

## 2020-05-24 DIAGNOSIS — Z3490 Encounter for supervision of normal pregnancy, unspecified, unspecified trimester: Secondary | ICD-10-CM

## 2020-05-24 DIAGNOSIS — R748 Abnormal levels of other serum enzymes: Secondary | ICD-10-CM | POA: Diagnosis not present

## 2020-05-24 DIAGNOSIS — Z8349 Family history of other endocrine, nutritional and metabolic diseases: Secondary | ICD-10-CM | POA: Diagnosis not present

## 2020-05-24 LAB — COMPREHENSIVE METABOLIC PANEL
ALT: 124 U/L — ABNORMAL HIGH (ref 0–44)
AST: 51 U/L — ABNORMAL HIGH (ref 15–41)
Albumin: 3.8 g/dL (ref 3.5–5.0)
Alkaline Phosphatase: 60 U/L (ref 38–126)
Anion gap: 8 (ref 5–15)
BUN: 7 mg/dL (ref 6–20)
CO2: 23 mmol/L (ref 22–32)
Calcium: 9.3 mg/dL (ref 8.9–10.3)
Chloride: 105 mmol/L (ref 98–111)
Creatinine, Ser: 0.59 mg/dL (ref 0.44–1.00)
GFR calc Af Amer: >60 mL/min (ref >60)
GFR calc non Af Amer: >60 mL/min (ref >60)
Glucose, Bld: 91 mg/dL (ref 70–99)
Potassium: 4.1 mmol/L (ref 3.5–5.1)
Sodium: 136 mmol/L (ref 135–145)
Total Bilirubin: 0.6 mg/dL (ref 0.3–1.2)
Total Protein: 7.1 g/dL (ref 6.5–8.1)

## 2020-05-24 LAB — WET PREP, GENITAL
Clue Cells Wet Prep HPF POC: NONE SEEN
Sperm: NONE SEEN
Trich, Wet Prep: NONE SEEN
Yeast Wet Prep HPF POC: NONE SEEN

## 2020-05-24 LAB — URINALYSIS, ROUTINE W REFLEX MICROSCOPIC
Bilirubin Urine: NEGATIVE
Glucose, UA: NEGATIVE mg/dL
Ketones, ur: 5 mg/dL — AB
Leukocytes,Ua: NEGATIVE
Nitrite: NEGATIVE
Protein, ur: NEGATIVE mg/dL
Specific Gravity, Urine: 1.026 (ref 1.005–1.030)
pH: 5 (ref 5.0–8.0)

## 2020-05-24 LAB — HEPATITIS PANEL, ACUTE
HCV Ab: NONREACTIVE
Hep A IgM: NONREACTIVE
Hep B C IgM: NONREACTIVE
Hepatitis B Surface Ag: NONREACTIVE

## 2020-05-24 LAB — CBC
HCT: 37.8 % (ref 36.0–46.0)
Hemoglobin: 12.6 g/dL (ref 12.0–15.0)
MCH: 31.2 pg (ref 26.0–34.0)
MCHC: 33.3 g/dL (ref 30.0–36.0)
MCV: 93.6 fL (ref 80.0–100.0)
Platelets: 344 10*3/uL (ref 150–400)
RBC: 4.04 MIL/uL (ref 3.87–5.11)
RDW: 12.6 % (ref 11.5–15.5)
WBC: 8.5 10*3/uL (ref 4.0–10.5)
nRBC: 0 % (ref 0.0–0.2)

## 2020-05-24 LAB — OB RESULTS CONSOLE GBS: GBS: POSITIVE

## 2020-05-24 LAB — HCG, QUANTITATIVE, PREGNANCY: hCG, Beta Chain, Quant, S: 59911 m[IU]/mL — ABNORMAL HIGH (ref <5)

## 2020-05-24 LAB — ABO/RH: ABO/RH(D): A POS

## 2020-05-24 NOTE — MAU Note (Signed)
Small spot (brown) x1 today. Denies any pain.  Has had some light cramping.  +preg test at primary on 6/14.

## 2020-05-24 NOTE — ED Provider Notes (Signed)
MSE was initiated and I personally evaluated the patient and placed orders (if any) at  2:10 PM on May 24, 2020.  Patient is a 24 year old G2P1 female who presents due to an episode of brown vaginal discharge that occurred about an hour ago.  She also notes some intermittent pelvic cramping for the past week.  No modifying factors.  None currently.  She stopped taking Depo-Provera on November 2020.  She never restarted her menstrual cycles.  She and her husband are sexually active without protection.  She went to see her primary care provider 3 weeks ago due to not having restarted her menstrual cycles and was informed that she had a positive urine pregnancy test at that time.  This was on June 14.  She has a follow-up scheduled with women's health on July 7.  No history of known miscarriages or elective abortions.  The patient appears stable so that the remainder of the MSE may be completed by another provider.   Placido Sou, PA-C 05/24/20 1422    Blane Ohara, MD 05/24/20 1557

## 2020-05-24 NOTE — Discharge Instructions (Signed)
First Trimester of Pregnancy The first trimester of pregnancy is from week 1 until the end of week 13 (months 1 through 3). A week after a sperm fertilizes an egg, the egg will implant on the wall of the uterus. This embryo will begin to develop into a baby. Genes from you and your partner will form the baby. The female genes will determine whether the baby will be a boy or a girl. At 6-8 weeks, the eyes and face will be formed, and the heartbeat can be seen on ultrasound. At the end of 12 weeks, all the baby's organs will be formed. Now that you are pregnant, you will want to do everything you can to have a healthy baby. Two of the most important things are to get good prenatal care and to follow your health care provider's instructions. Prenatal care is all the medical care you receive before the baby's birth. This care will help prevent, find, and treat any problems during the pregnancy and childbirth. Body changes during your first trimester Your body goes through many changes during pregnancy. The changes vary from woman to woman.  You may gain or lose a couple of pounds at first.  You may feel sick to your stomach (nauseous) and you may throw up (vomit). If the vomiting is uncontrollable, call your health care provider.  You may tire easily.  You may develop headaches that can be relieved by medicines. All medicines should be approved by your health care provider.  You may urinate more often. Painful urination may mean you have a bladder infection.  You may develop heartburn as a result of your pregnancy.  You may develop constipation because certain hormones are causing the muscles that push stool through your intestines to slow down.  You may develop hemorrhoids or swollen veins (varicose veins).  Your breasts may begin to grow larger and become tender. Your nipples may stick out more, and the tissue that surrounds them (areola) may become darker.  Your gums may bleed and may be  sensitive to brushing and flossing.  Dark spots or blotches (chloasma, mask of pregnancy) may develop on your face. This will likely fade after the baby is born.  Your menstrual periods will stop.  You may have a loss of appetite.  You may develop cravings for certain kinds of food.  You may have changes in your emotions from day to day, such as being excited to be pregnant or being concerned that something may go wrong with the pregnancy and baby.  You may have more vivid and strange dreams.  You may have changes in your hair. These can include thickening of your hair, rapid growth, and changes in texture. Some women also have hair loss during or after pregnancy, or hair that feels dry or thin. Your hair will most likely return to normal after your baby is born. What to expect at prenatal visits During a routine prenatal visit:  You will be weighed to make sure you and the baby are growing normally.  Your blood pressure will be taken.  Your abdomen will be measured to track your baby's growth.  The fetal heartbeat will be listened to between weeks 10 and 14 of your pregnancy.  Test results from any previous visits will be discussed. Your health care provider may ask you:  How you are feeling.  If you are feeling the baby move.  If you have had any abnormal symptoms, such as leaking fluid, bleeding, severe headaches, or abdominal   cramping.  If you are using any tobacco products, including cigarettes, chewing tobacco, and electronic cigarettes.  If you have any questions. Other tests that may be performed during your first trimester include:  Blood tests to find your blood type and to check for the presence of any previous infections. The tests will also be used to check for low iron levels (anemia) and protein on red blood cells (Rh antibodies). Depending on your risk factors, or if you previously had diabetes during pregnancy, you may have tests to check for high blood sugar  that affects pregnant women (gestational diabetes).  Urine tests to check for infections, diabetes, or protein in the urine.  An ultrasound to confirm the proper growth and development of the baby.  Fetal screens for spinal cord problems (spina bifida) and Down syndrome.  HIV (human immunodeficiency virus) testing. Routine prenatal testing includes screening for HIV, unless you choose not to have this test.  You may need other tests to make sure you and the baby are doing well. Follow these instructions at home: Medicines  Follow your health care provider's instructions regarding medicine use. Specific medicines may be either safe or unsafe to take during pregnancy.  Take a prenatal vitamin that contains at least 600 micrograms (mcg) of folic acid.  If you develop constipation, try taking a stool softener if your health care provider approves. Eating and drinking   Eat a balanced diet that includes fresh fruits and vegetables, whole grains, good sources of protein such as meat, eggs, or tofu, and low-fat dairy. Your health care provider will help you determine the amount of weight gain that is right for you.  Avoid raw meat and uncooked cheese. These carry germs that can cause birth defects in the baby.  Eating four or five small meals rather than three large meals a day may help relieve nausea and vomiting. If you start to feel nauseous, eating a few soda crackers can be helpful. Drinking liquids between meals, instead of during meals, also seems to help ease nausea and vomiting.  Limit foods that are high in fat and processed sugars, such as fried and sweet foods.  To prevent constipation: ? Eat foods that are high in fiber, such as fresh fruits and vegetables, whole grains, and beans. ? Drink enough fluid to keep your urine clear or pale yellow. Activity  Exercise only as directed by your health care provider. Most women can continue their usual exercise routine during  pregnancy. Try to exercise for 30 minutes at least 5 days a week. Exercising will help you: ? Control your weight. ? Stay in shape. ? Be prepared for labor and delivery.  Experiencing pain or cramping in the lower abdomen or lower back is a good sign that you should stop exercising. Check with your health care provider before continuing with normal exercises.  Try to avoid standing for long periods of time. Move your legs often if you must stand in one place for a long time.  Avoid heavy lifting.  Wear low-heeled shoes and practice good posture.  You may continue to have sex unless your health care provider tells you not to. Relieving pain and discomfort  Wear a good support bra to relieve breast tenderness.  Take warm sitz baths to soothe any pain or discomfort caused by hemorrhoids. Use hemorrhoid cream if your health care provider approves.  Rest with your legs elevated if you have leg cramps or low back pain.  If you develop varicose veins in   your legs, wear support hose. Elevate your feet for 15 minutes, 3-4 times a day. Limit salt in your diet. Prenatal care  Schedule your prenatal visits by the twelfth week of pregnancy. They are usually scheduled monthly at first, then more often in the last 2 months before delivery.  Write down your questions. Take them to your prenatal visits.  Keep all your prenatal visits as told by your health care provider. This is important. Safety  Wear your seat belt at all times when driving.  Make a list of emergency phone numbers, including numbers for family, friends, the hospital, and police and fire departments. General instructions  Ask your health care provider for a referral to a local prenatal education class. Begin classes no later than the beginning of month 6 of your pregnancy.  Ask for help if you have counseling or nutritional needs during pregnancy. Your health care provider can offer advice or refer you to specialists for help  with various needs.  Do not use hot tubs, steam rooms, or saunas.  Do not douche or use tampons or scented sanitary pads.  Do not cross your legs for long periods of time.  Avoid cat litter boxes and soil used by cats. These carry germs that can cause birth defects in the baby and possibly loss of the fetus by miscarriage or stillbirth.  Avoid all smoking, herbs, alcohol, and medicines not prescribed by your health care provider. Chemicals in these products affect the formation and growth of the baby.  Do not use any products that contain nicotine or tobacco, such as cigarettes and e-cigarettes. If you need help quitting, ask your health care provider. You may receive counseling support and other resources to help you quit.  Schedule a dentist appointment. At home, brush your teeth with a soft toothbrush and be gentle when you floss. Contact a health care provider if:  You have dizziness.  You have mild pelvic cramps, pelvic pressure, or nagging pain in the abdominal area.  You have persistent nausea, vomiting, or diarrhea.  You have a bad smelling vaginal discharge.  You have pain when you urinate.  You notice increased swelling in your face, hands, legs, or ankles.  You are exposed to fifth disease or chickenpox.  You are exposed to [place] measles (rubella) and have never had it. Get help right away if:  You have a fever.  You are leaking fluid from your vagina.  You have spotting or bleeding from your vagina.  You have severe abdominal cramping or pain.  You have rapid weight gain or loss.  You vomit blood or material that looks like coffee grounds.  You develop a severe headache.  You have shortness of breath.  You have any kind of trauma, such as from a fall or a car accident. Summary  The first trimester of pregnancy is from week 1 until the end of week 13 (months 1 through 3).  Your body goes through many changes during pregnancy. The changes vary from  woman to woman.  You will have routine prenatal visits. During those visits, your health care provider will examine you, discuss any test results you may have, and talk with you about how you are feeling. This information is not intended to replace advice given to you by your health care provider. Make sure you discuss any questions you have with your health care provider. Document Revised: 10/23/2017 Document Reviewed: 10/22/2016 Elsevier Patient Education  2020 [employer].  Safe Medications in Pregnancy    Acne: Benzoyl Peroxide Salicylic Acid  Backache/Headache: Tylenol: 2 regular strength every 4 hours OR              2 Extra strength every 6 hours  Colds/Coughs/Allergies: Benadryl (alcohol free) 25 mg every 6 hours as needed Breath right strips Claritin Cepacol throat lozenges Chloraseptic throat spray Cold-Eeze- up to three times per day Cough drops, alcohol free Flonase (by prescription only) Guaifenesin Mucinex Robitussin DM (plain only, alcohol free) Saline nasal spray/drops Sudafed (pseudoephedrine) & Actifed ** use only after [redacted] weeks gestation and if you do not have high blood pressure Tylenol Vicks Vaporub Zinc lozenges Zyrtec   Constipation: Colace Ducolax suppositories Fleet enema Glycerin suppositories Metamucil Milk of magnesia Miralax Senokot Smooth move tea  Diarrhea: Kaopectate Imodium A-D  *NO pepto Bismol  Hemorrhoids: Anusol Anusol HC Preparation H Tucks  Indigestion: Tums Maalox Mylanta Zantac  Pepcid  Insomnia: Benadryl (alcohol free) 25mg  every 6 hours as needed Tylenol PM Unisom, no Gelcaps  Leg Cramps: Tums MagGel  Nausea/Vomiting:  Bonine Dramamine Emetrol Ginger extract Sea bands Meclizine  Nausea medication to take during pregnancy:  Unisom (doxylamine succinate 25 mg tablets) Take one tablet daily at bedtime. If symptoms are not adequately controlled, the dose can be  increased to a maximum recommended dose of two tablets daily (1/2 tablet in the morning, 1/2 tablet mid-afternoon and one at bedtime). Vitamin B6 100mg  tablets. Take one tablet twice a day (up to 200 mg per day).  Skin Rashes: Aveeno products Benadryl cream or 25mg  every 6 hours as needed Calamine Lotion 1% cortisone cream  Yeast infection: Gyne-lotrimin 7 Monistat 7   **If taking multiple medications, please check labels to avoid duplicating the same active ingredients **take medication as directed on the label ** Do not exceed 4000 mg of tylenol in 24 hours **Do not take medications that contain aspirin or ibuprofen           Alanine Aminotransferase Test Why am I having this test? An alanine aminotransferase (ALT) test is used to evaluate a person for liver disease. It may also be used to monitor the treatment of a person who has liver disease. High levels of ALT may indicate that there is a liver problem or that a treatment for a liver problem is not working. A health care provider may order an ALT test along with other tests. What is being tested? This test measures the level of alanine aminotransferase in the blood. Alanine aminotransferase is an enzyme that is found mainly in the liver but also in the kidneys, the heart, and muscles that are connected to the skeleton (skeletal muscles). Under normal conditions, ALT levels in the blood are low. High (elevated) ALT levels may be caused by:  Damage to the liver. This is the main cause of an abnormality.  Damage to the kidneys, the heart, or skeletal muscles.  Certain medicines. What kind of sample is taken?  A blood sample is required for this test. It is usually collected by inserting a needle into a blood vessel. How are the results reported? Your test results will be reported as a range of values. Your health care provider will compare your results to normal values that were established after testing a large group of  people (reference values). Reference values may vary among labs and hospitals. For this test, common reference values are:  Adult, female: 4-19 units/L.  Adult, female: 7-30 units/L.  Children: ? Younger than 12  months: 0-54 units/L. ? 56-49 years old: 3-37 units/L. ? 31-18 years old: 3-30 units/L. ? 5-52 years old: 3-28 units/L. What do the results mean? Test results that are significantly elevated may indicate:  Hepatitis.  Liver tissue death (hepatic necrosis).  Decreased blood flow to the liver (hepatic ischemia). Test results that are moderately elevated may indicate:  Cirrhosis.  Liver tumor.  Medicines that are causing strain on the liver.  Severe burns. Test results that are mildly elevated may indicate:  Pancreatitis.  Heart attack.  Infectious mononucleosis.  Shock. Talk with your health care provider about what your results mean. Questions to ask your health care provider Ask your health care provider, or the department that is doing the test:  When will my results be ready?  How will I get my results?  What are my treatment options?  What other tests do I need?  What are my next steps? Summary  An alanine aminotransferase (ALT) test is used to evaluate a person for liver disease.  High (elevated) ALT levels are commonly caused by medicines or damage to the liver.  Talk with your health care provider about your results, your treatment options, and whether you need more tests. This information is not intended to replace advice given to you by your health care provider. Make sure you discuss any questions you have with your health care provider. Document Revised: 10/23/2017 Document Reviewed: 06/12/2017 Elsevier Patient Education  2020 [employer] Inc[employer]        Hepatitis A  Hepatitis A is a liver infection that is caused by the hepatitis A virus (HAV). HAV infects and causes inflammation in the liver. The virus spreads easily from person to person  (is contagious). Usually, hepatitis A infection is mild, and the person recovers fully. The hepatitis A vaccine can prevent this infection. What are the causes? This condition is caused by HAV. The virus may be spread by:  Eating food or drinking water that has the virus in it (is contaminated).  Having sex with someone who is infected. The virus can be spread through oral, vaginal, or anal sex.  Coming into contact with the stool of a person who is infected and passing the virus to yourself. This can happen by touching your hands to your mouth. What increases the risk? The following factors make you more likely to develop this condition:  Having contact with contaminated needles or syringes. This may happen while: ? Receiving acupuncture. ? Getting a tattoo. ? Getting a body piercing. ? Injecting drugs.  Being unable to get clean water or food.  Working at a day care or nursing home. This increases your risk because you are in contact with stool while: ? Changing diapers. ? Helping others with self-care of their bodies (general hygiene).  Having HIV (human immunodeficiency virus) or AIDS (acquired immunodeficiency syndrome).  Living in or traveling to countries where hepatitis A is common.  Being a female who has sex with other males.  Having oral or anal sex.  Having a bleeding disorder where the blood does not clot normally, such as hemophilia.  Having liver disease that is long-term (chronic). What are the signs or symptoms? Symptoms of this condition include:  Loss of appetite.  Tiredness (fatigue).  Nausea or vomiting.  Stomach pain.  Dark yellow urine.  Your skin or the white parts of your eyes turning yellow (jaundice).  A fever.  Itchy skin.  Light-colored stool.  Joint pain. Often, hepatitis A causes no symptoms. How is  this diagnosed? This condition is diagnosed based on:  A physical exam.  Your medical history.  Blood tests. How is this  treated? This condition usually goes away on its own after several weeks or months. There is no specific treatment for this infection. Severe cases of hepatitis A are rare. If your infection is severe, you may need hospital care to:  Treat dehydration.  Have your liver function watched. Follow these instructions at home: Medicines  Take over-the-counter and prescription medicines only as told by your health care provider.  Do not take any: ? Over-the-counter medicines that contain acetaminophen. ? New medicines, including over-the-counter medicines and supplements, unless your health care provider approves. Lifestyle   Rest. Make sure you: ? Get plenty of sleep. Avoid staying up late. ? Keep the same bedtime hours on weekends and weekdays. ? Take daytime naps or rest breaks when tired.  Do not have sex unless approved by your health care provider.  Do not drink alcohol until your health care provider approves. General instructions   Eat a balanced diet with plenty of fruits and vegetables, whole grains, and lean meats or non-meat proteins (such as beans or tofu).  Return to your normal activities as told by your health care provider. Ask your health care provider: ? What activities are safe for you. ? When you may return to school or work.  Wash your hands often with soap and water for at least 20 seconds. If soap and water are not available, use hand sanitizer. This is especially important: ? After using the bathroom or changing diapers. ? Before handling food or water.  Avoid swimming and using hot tubs until your health care provider approves.  Tell your health care provider about all the people you live with or with whom you have close contact. Your health care provider may recommend that they receive the HAV vaccine.  Follow other instructions from your health care provider about how to avoid spreading HAV.  Keep all follow-up visits as told by your health care  provider. This is important. How is this prevented?  Get the HAV vaccine. This helps prevent the hepatitis A infection.  If you have been recently exposed to HAV, your health care provider may recommend that you get one of the following to help prevent infection: ? Human immunoglobulin. This is a blood protein that fights germs in the body. ? The HAV vaccine. Even if you got this vaccine as a child, a booster shot may help.  Wash your hands often with soap and water for at least 20 seconds.  If you travel to a developing country: ? Avoid food that is raw or not cooked enough. ? Drink bottled water only. ? Use bottled water to brush your teeth, make ice cubes, and wash fruits and vegetables.  Use a condom every time you have vaginal, oral, or anal sex. Be sure to use it correctly each time. ? Both females and males should wear condoms. ? Condoms should be kept in place from the beginning to the end of sexual activity. ? Latex condoms should be used, if possible. These offer the best protection. Where to find more information  Centers for Disease Control and Prevention: [employer]ches.[employer].[place] Contact a health care provider if:  You have a fever.  Your symptoms get worse. Get help right away if you:  Cannot eat or drink, or cannot eat or drink without vomiting.  Feel confused.  Have jaundice that is getting worse.  Are very sleepy  or have trouble waking up.  Have bleeding or bruising that keeps happening. Summary  Hepatitis A is a liver infection that is caused by the hepatitis A virus (HAV). HAV causes inflammation in the liver and spreads easily from person to person (is contagious).  HAV can be spread by eating food or drinking water that has the virus in it (is contaminated).  Do not take any new medicines, including over-the-counter medicines and supplements, unless your health care provider approves.  Wash your hands often with soap and water for at least 20 seconds.  If soap and water are not available, use hand sanitizer. This is especially important after using the bathroom or changing diapers, and before handling food or water. This information is not intended to replace advice given to you by your health care provider. Make sure you discuss any questions you have with your health care provider. Document Revised: 08/03/2019 Document Reviewed: 07/11/2019 Elsevier Patient Education  2020 [employer] Inc[employer]        Hepatitis B Hepatitis B is a liver infection that is caused by the hepatitis B virus (HBV). HBV causes inflammation in the liver. There are two kinds of hepatitis B:  Acute hepatitis B. This lasts for 6 months or less.  Long-term (chronic) hepatitis B. This lasts for more than 6 months. Chronic hepatitis B can lead to: ? A condition where the liver cannot work anymore (liver failure). ? Scarring of the liver (cirrhosis). ? Liver cancer. Most adults with acute hepatitis B do not develop chronic hepatitis B. Babies and young children who get hepatitis B are more likely to develop chronic hepatitis B than adults. The HBV vaccine can prevent this infection. What are the causes? This condition is caused by HBV. The virus may spread from person to person (is contagious) through:  Contact with the body fluids of an infected person. This includes blood, breast milk, tears, saliva, semen, and vaginal fluids.  Childbirth. A woman who has hepatitis B can pass it to her baby during birth. What increases the risk? The following factors may make you more likely to develop this condition:  Having contact with needles or syringes that have HBV on them (are contaminated). Contact may happen while: ? Receiving acupuncture. ? Getting a tattoo. ? Getting a body piercing. ? Injecting drugs.  Having sex with someone who is infected and not using a condom. The virus can be passed through vaginal, anal, or oral sex.  Living with or having close contact with a  person who has hepatitis B.  Working in a job that involves contact with blood or body fluids, such as in health care.  Traveling to a country where hepatitis B is common.  Receiving treatment to filter your blood (kidney dialysis).  Having a history of: ? Blood transfusion. This is receiving donated blood. ? Organ transplantation. This is receiving a donated body organ from another person. What are the signs or symptoms? Symptoms of this condition may include:  Loss of appetite.  Tiredness (fatigue).  Nausea or vomiting.  Stomach pain.  Dark yellow urine.  Your skin or the white parts of your eyes turning yellow (jaundice).  A fever.  Light-colored or tan stool.  Joint pain. Often, hepatitis B causes no symptoms. How is this diagnosed? This condition is diagnosed based on:  A physical exam.  Your medical history.  Blood tests. How is this treated? Treatment for chronic hepatitis B may include antiviral medicine. This medicine may help:  Lower your risk  of liver failure, cirrhosis, or liver cancer.  Lower your ability to pass HBV to others. You will need to avoid taking certain medicines that can be hard for the liver to break down (metabolize). This helps prevent further damage to your liver. Follow these instructions at home: Medicines  Take over-the-counter and prescription medicines only as told by your health care provider.  If you were prescribed an antiviral medicine, take it as told by your health care provider. Do not stop using the antiviral even if you start to feel better.  Do not take any: ? Over-the-counter medicines that contain acetaminophen. ? New medicines, including over-the-counter medicines or supplements, unless approved by your health care provider. Activity  Rest as needed.  Do not have sex unless approved by your health care provider.  Return to your normal activities as told by your health care provider. Ask your health care  provider what activities are safe for you.  Avoid swimming and using hot tubs until your health care provider approves.  Ask your health care provider when you may return to school or work. Eating and drinking      Eat a balanced diet with plenty of fruits and vegetables, whole grains, and lean meats or non-meat proteins (such as beans or tofu).  Drink enough fluids to keep your urine pale yellow.  Do not drink alcohol. General instructions  Do not share toothbrushes, nail clippers, or razors.  Wash your hands often with soap and water for at least 20 seconds. If soap and water are not available, use hand sanitizer.  Tell your health care provider about all the people you live with or with whom you have close contact. Your health care provider may recommend that they receive the HBV vaccine.  Cover any cuts or open sores on your skin to prevent spreading HBV.  Follow other instructions from your health care provider about how to avoid spreading HBV.  Keep all follow-up visits as told by your health care provider. This is important. How is this prevented?  Get the HBV vaccine. This helps prevent the hepatitis B infection.  If you have been recently exposed to HBV, your health care provider may recommend that you get one of the following to help prevent infection: ? Human immunoglobulin. This is a blood protein that fights germs in the body. ? The HBV vaccine. Even if you got this vaccine as a child, a booster shot may help.  Wash your hands often with soap and water for at least 20 seconds.  Use a condom every time you have vaginal, oral, or anal sex. Be sure to use it correctly each time. ? Both females and males should wear condoms. ? Condoms should be kept in place from the beginning to the end of sexual activity. ? Latex condoms should be used, if possible. These offer the best protection.  Do not share needles or syringes.  Avoid handling blood or other body fluids  without gloves or other protection.  Avoid getting tattoos or piercings in shops and other places that are not clean. Where to find more information  Centers for Disease Control and Prevention: SaveSearches.co.nzwww.cdc.gov/hepatitis Contact a health care provider if you:  Develop a rash.  Develop jaundice, or your chronic jaundice is more severe.  Have a fever. Get help right away if you:  Are unable to eat or drink.  Have a fever as well as nausea and vomiting.  Feel confused.  Have trouble breathing.  Have swelling of your  skin, throat, mouth, or face.  Have a seizure.  Become very sleepy or have trouble waking up.  Have a swollen stomach. Summary  Hepatitis B is a liver infection that is caused by the hepatitis B virus (HBV). There are two kinds of hepatitis B, acute and chronic.  HBV may spread from person to person (is contagious).  Do not take any new medicines, including over-the-counter medicines or supplements, unless approved by your health care provider.  To help prevent hepatitis B, wash your hands often with soap and water for at least 20 seconds. If soap and water are not available, use hand sanitizer. This information is not intended to replace advice given to you by your health care provider. Make sure you discuss any questions you have with your health care provider. Document Revised: 08/16/2019 Document Reviewed: 07/11/2019 Elsevier Patient Education  2020 [employer]vier Inc.        Hepatitis C Hepatitis C is a liver infection that is caused by the hepatitis C virus (HCV). The virus infects and causes inflammation in the liver. Hepatitis C can lead to:  A condition where the liver cannot work anymore (liver failure).  Scarring of the liver (cirrhosis).  Liver cancer. People with hepatitis C often do not know for months or years that they have it. This is because they often do not have symptoms or may have only mild symptoms. What are the causes? This condition  is caused by HCV. The virus can spread from person to person (is contagious) through:  Contact with an infected person's blood, semen, or vaginal fluids.  Childbirth. A woman who has hepatitis C can pass it to her baby during birth.  Donated blood (blood transfusion) or a donated body organ (organ transplantation) if received in the [place] before 1992. What increases the risk? The following factors may make you more likely to develop this condition:  Having contact with needles or syringes that have HCV on them (are contaminated). Contact may happen while: ? Receiving acupuncture. ? Getting a tattoo. ? Getting a body piercing. ? Injecting drugs.  Having sex with someone who is infected. The virus can spread through vaginal, oral, or anal sex.  Receiving treatment to filter your blood (kidney dialysis).  Having HIV (human immunodeficiency virus) or AIDS (acquired immunodeficiency syndrome).  Having a job that involves contact with blood or certain other body fluids, such as in health care. What are the signs or symptoms? Symptoms of this condition include:  Tiredness (fatigue).  Loss of appetite.  Nausea or vomiting.  Pain in your abdomen.  Dark yellow urine.  Your skin or the white parts of your eyes turning yellow (jaundice).  Itchy skin.  Light-colored or tan stool.  Joint pain.  Bleeding and bruising that happen often.  Fluid building up in your stomach (ascites). Often, hepatitis C causes no symptoms. How is this diagnosed? This condition is diagnosed with:  Blood tests.  Other tests of how well your liver is working. These may include: ? Magnetic resonance elastography (MRE). This imaging test uses MRI and sound waves to measure liver stiffness. ? Transient elastography. This imaging test uses ultrasound to measure liver stiffness. ? Liver biopsy. This test involves taking a tissue sample from your liver to look at under a microscope. How is this  treated? Treatment may depend on how severe your condition is, how long it has lasted, and whether you have liver damage. More testing may be done to figure out the best treatment. Treatment  may include:  Taking antiviral medicines and other medicines.  Having follow-up treatments every 6-12 months for infections or other liver problems.  Having liver transplantation. Follow these instructions at home: Medicines  Take over-the-counter and prescription medicines only as told by your health care provider.  If you were prescribed an antiviral medicine, take it as told by your health care provider. Do not stop using the antiviral even if you start to feel better.  Do not take any new medicines, including over-the-counter medicines or supplements, unless your health care provider approves. Activity  Rest as needed.  Do not have sex unless approved by your health care provider.  Return to your normal activities as told by your health care provider. Ask your health care provider what activities are safe for you.  Ask your health care provider when you may return to school or work. Eating and drinking   Eat a balanced diet with plenty of fruits and vegetables, whole grains, and lean meats or non-meat proteins (such as beans or tofu).  Drink enough fluids to keep your urine pale yellow.  Do not drink alcohol. General instructions  Do not share toothbrushes, nail clippers, or razors.  Wash your hands often with soap and water for at least 20 seconds. If soap and water are not available, use hand sanitizer.  Cover any cuts or open sores on your skin to prevent spreading HCV.  Avoid swimming or using hot tubs if you have open sores or wounds.  Keep all follow-up visits as told by your health care provider. This is important. You may need follow-up visits every 6-12 months. How is this prevented? There is no vaccine for hepatitis C. The only way to prevent this infection is to lessen  your risk of coming into contact with HCV. Make sure you:  Wash your hands often with soap and water for at least 20 seconds.  Do not share needles or syringes.  Use a condom every time you have vaginal, oral, or anal sex. Be sure to use it correctly each time. ? Both females and males should wear condoms. ? Condoms should be kept in place from the beginning to the end of sexual activity. ? Latex condoms should be used, if possible. These offer the best protection.  Avoid handling blood or other body fluids without gloves or other protection.  Avoid getting tattoos or body piercings in shops or other places that are not clean. Where to find more information  Centers for Disease Control and Prevention: SaveSearches.co.nz Contact a health care provider if you:  Have a fever.  Have pain in your abdomen.  Pass dark urine.  Pass light-colored or tan stool.  Have joint pain. Get help right away if you:  Have an increase in fatigue or weakness.  Lose your appetite.  Cannot eat or drink without vomiting.  Develop jaundice or your jaundice gets worse.  Bruise or bleed easily. Summary  Hepatitis C is a liver infection that is caused by the hepatitis C virus (HCV). This infection can lead to a condition where the liver cannot work anymore (liver failure), scarring of the liver (cirrhosis), or liver cancer.  HCV causes this condition and can spread from person to person (is contagious).  Do not take any medicines, including over-the-counter medicines or supplements, unless your health care provider approves. This information is not intended to replace advice given to you by your health care provider. Make sure you discuss any questions you have with your health care  provider. Document Revised: 08/03/2019 Document Reviewed: 07/11/2019 Elsevier Patient Education  2020 [employer].

## 2020-05-24 NOTE — MAU Provider Note (Signed)
History     CSN: [id number]  Arrival date and time: 05/24/20 1334   First Provider Initiated Contact with Patient 05/24/20 1552      Chief Complaint  Patient presents with  . Vaginal Bleeding   [name] is a 24 y.o. G2P1001 at Unknown who presents to MAU for vaginal bleeding which began this morning. Patient endorses single brown spot in her underwear earlier today and denies any other abnormal discharge/bleeding since then. Patient also reports intermittent cramping, but none at this time. Pt's husband present for entire visit.  Passing blood clots? no Blood soaking clothes? no Lightheaded/dizzy? no Significant pelvic pain or cramping? Per above Passed any tissue? no  Current pregnancy problems? Pt has not yet been seen Blood Type? unknown Allergies? Peanuts, PCN (former allergy to eggs) Current medications? PNVs Current PNC & next appt? [facility], NOB RN intake 05/30/2020  Pt denies vaginal discharge/odor/itching. Pt denies N/V, abdominal pain, constipation, diarrhea, or urinary problems. Pt denies fever, chills, fatigue, sweating or changes in appetite. Pt denies SOB or chest pain. Pt denies dizziness, HA, light-headedness, weakness.   OB History    Gravida  2   Para  1   Term  1   Preterm      AB      Living  1     SAB      TAB      Ectopic      Multiple      Live Births  1           Past Medical History:  Diagnosis Date  . Asthma     Past Surgical History:  Procedure Laterality Date  . NO PAST SURGERIES      Family History  Problem Relation Age of Onset  . Hyperlipidemia Mother   . Hypertension Mother   . Hyperlipidemia Maternal Grandmother   . Hypertension Maternal Grandmother     Social History   Tobacco Use  . Smoking status: Never Smoker  . Smokeless tobacco: Never Used  Substance Use Topics  . Alcohol use: No    Alcohol/week: 0.0 standard drinks  . Drug use: No    Allergies:  Allergies  Allergen Reactions  .  Eggs Or Egg-Derived Products   . Peanuts [Peanut Oil]   . Penicillins Rash    Medications Prior to Admission  Medication Sig Dispense Refill Last Dose  . gabapentin (NEURONTIN) 100 MG capsule Take 1 capsule (100 mg total) by mouth 3 (three) times daily. (Patient not taking: Reported on 05/07/2020) 30 capsule 3   . Magnesium 500 MG CAPS 500mg  daily for migraine prevention 30 capsule 1   . ondansetron (ZOFRAN) 4 MG tablet Take 1 tablet (4 mg total) by mouth every 8 (eight) hours as needed for nausea or vomiting. 20 tablet 0   . SUMAtriptan (IMITREX) 25 MG tablet Take 1 tablet (25 mg total) by mouth every 2 (two) hours as needed for migraine. May repeat in 2 hours if headache persists or recurs. 10 tablet 3     Review of Systems  Constitutional: Negative for chills, diaphoresis, fatigue and fever.  Eyes: Negative for visual disturbance.  Respiratory: Negative for shortness of breath.   Cardiovascular: Negative for chest pain.  Gastrointestinal: Negative for abdominal pain, constipation, diarrhea, nausea and vomiting.  Genitourinary: Positive for pelvic pain (intermittent pelvic cramping, not present at this time) and vaginal bleeding (single episode of brown discharge). Negative for dysuria, flank pain, frequency, urgency and vaginal discharge.  Neurological:  Negative for dizziness, weakness, light-headedness and headaches.   Physical Exam   Blood pressure 116/72, pulse 76, temperature 99 F (37.2 C), resp. rate 18, height 5' (1.524 m), weight 70.5 kg, SpO2 99 %.  Patient Vitals for the past 24 hrs:  BP Temp Pulse Resp SpO2 Height Weight  05/24/20 1502 116/72 -- 76 -- 99 % 5' (1.524 m) 70.5 kg  05/24/20 1343 116/80 99 F (37.2 C) 77 18 97 % -- --   Physical Exam Constitutional:      General: She is not in acute distress.    Appearance: She is normal weight. She is not ill-appearing, toxic-appearing or diaphoretic.  HENT:     Head: Normocephalic and atraumatic.  Pulmonary:      Effort: Pulmonary effort is normal.  Neurological:     Mental Status: She is alert.  Psychiatric:        Mood and Affect: Mood normal.        Behavior: Behavior normal.        Thought Content: Thought content normal.        Judgment: Judgment normal.    Results for orders placed or performed during the hospital encounter of 05/24/20 (from the past 24 hour(s))  Urinalysis, Routine w reflex microscopic     Status: Abnormal   Collection Time: 05/24/20  3:30 PM  Result Value Ref Range   Color, Urine YELLOW YELLOW   APPearance CLEAR CLEAR   Specific Gravity, Urine 1.026 1.005 - 1.030   pH 5.0 5.0 - 8.0   Glucose, UA NEGATIVE NEGATIVE mg/dL   Hgb urine dipstick SMALL (A) NEGATIVE   Bilirubin Urine NEGATIVE NEGATIVE   Ketones, ur 5 (A) NEGATIVE mg/dL   Protein, ur NEGATIVE NEGATIVE mg/dL   Nitrite NEGATIVE NEGATIVE   Leukocytes,Ua NEGATIVE NEGATIVE   RBC / HPF 6-10 0 - 5 RBC/hpf   WBC, UA 0-5 0 - 5 WBC/hpf   Bacteria, UA RARE (A) NONE SEEN   Squamous Epithelial / LPF 0-5 0 - 5   Mucus PRESENT   CBC     Status: None   Collection Time: 05/24/20  4:03 PM  Result Value Ref Range   WBC 8.5 4.0 - 10.5 K/uL   RBC 4.04 3.87 - 5.11 MIL/uL   Hemoglobin 12.6 12.0 - 15.0 g/dL   HCT 19.4 36 - 46 %   MCV 93.6 80.0 - 100.0 fL   MCH 31.2 26.0 - 34.0 pg   MCHC 33.3 30.0 - 36.0 g/dL   RDW 17.4 08.1 - 44.8 %   Platelets 344 150 - 400 K/uL   nRBC 0.0 0.0 - 0.2 %  Comprehensive metabolic panel     Status: Abnormal   Collection Time: 05/24/20  4:03 PM  Result Value Ref Range   Sodium 136 135 - 145 mmol/L   Potassium 4.1 3.5 - 5.1 mmol/L   Chloride 105 98 - 111 mmol/L   CO2 23 22 - 32 mmol/L   Glucose, Bld 91 70 - 99 mg/dL   BUN 7 6 - 20 mg/dL   Creatinine, Ser 1.85 0.44 - 1.00 mg/dL   Calcium 9.3 8.9 - 63.1 mg/dL   Total Protein 7.1 6.5 - 8.1 g/dL   Albumin 3.8 3.5 - 5.0 g/dL   AST 51 (H) 15 - 41 U/L   ALT 124 (H) 0 - 44 U/L   Alkaline Phosphatase 60 38 - 126 U/L   Total Bilirubin 0.6  0.3 - 1.2 mg/dL   GFR calc non  Af Amer >60 >60 mL/min   GFR calc Af Amer >60 >60 mL/min   Anion gap 8 5 - 15  hCG, quantitative, pregnancy     Status: Abnormal   Collection Time: 05/24/20  4:03 PM  Result Value Ref Range   hCG, Beta Chain, Quant, S 59,911 (H) <5 mIU/mL  ABO/Rh     Status: None   Collection Time: 05/24/20  4:03 PM  Result Value Ref Range   ABO/RH(D) A POS    No rh immune globuloin      NOT A RH IMMUNE GLOBULIN CANDIDATE, PT RH POSITIVE Performed at [facility], [address], [place], [place] [address]   Wet prep, genital     Status: Abnormal   Collection Time: 05/24/20  4:20 PM  Result Value Ref Range   Yeast Wet Prep HPF POC NONE SEEN NONE SEEN   Trich, Wet Prep NONE SEEN NONE SEEN   Clue Cells Wet Prep HPF POC NONE SEEN NONE SEEN   WBC, Wet Prep HPF POC MODERATE (A) NONE SEEN   Sperm NONE SEEN    US OB LESS THAN 14 WEEKS WITH OB TRANSVAGINAL  Result Date: 05/24/2020 CLINICAL DATA:  Vaginal bleeding EXAM: OBSTETRIC <14 WK [place] AND TRANSVAGINAL OB US TECHNIQUE: Both transabdominal and transvaginal ultrasound examinations were performed for complete evaluation of the gestation as well as the maternal uterus, adnexal regions, and pelvic cul-de-sac. Transvaginal technique was performed to assess early pregnancy. COMPARISON:  None. FINDINGS: Intrauterine gestational sac: Present Yolk sac:  Present Embryo:  Present Cardiac Activity: Present Heart Rate: 123 bpm CRL:  4.5 mm   6 w   1 d                  [place] EDC: 01/16/2021 Subchorionic hemorrhage:  None visualized. Maternal uterus/adnexae: Ovaries are within normal limits. No free fluid is noted. IMPRESSION: Single live intrauterine gestation at 6 weeks 1 day. Electronically Signed   By: Alcide Clever M.D.   On: 05/24/2020 17:57    MAU Course  Procedures  MDM -r/o ectopic -UA: sm hgb/5ketones/rare bacteria, sending urine for culture -CBC: WNL -CMP: AST/ALT 51/124 (elevated compared to 2 weeks ago), hepatitis panel  drawn per consultation with Dr. Donavan Foil -[place]: single IUP, FHR 123, [redacted]w[redacted]d -hCG: 37,106 -ABO: A Positive -WetPrep: WNL -GC/CT collected -pt discharged to home in stable condition  Orders Placed This Encounter  Procedures  . Wet prep, genital    Standing Status:   Standing    Number of Occurrences:   1  . Culture, OB Urine    Standing Status:   Standing    Number of Occurrences:   1  . US OB LESS THAN 14 WEEKS WITH OB TRANSVAGINAL    Standing Status:   Standing    Number of Occurrences:   1    Order Specific Question:   Symptom/Reason for Exam    Answer:   Vaginal bleeding in pregnancy [[id number]]  . Urinalysis, Routine w reflex microscopic    Standing Status:   Standing    Number of Occurrences:   1  . CBC    Standing Status:   Standing    Number of Occurrences:   1  . Comprehensive metabolic panel    Standing Status:   Standing    Number of Occurrences:   1  . hCG, quantitative, pregnancy    Standing Status:   Standing    Number of Occurrences:   1  . Hepatitis  panel, acute    Standing Status:   Standing    Number of Occurrences:   1  . AMB referral to headache clinic    Referral Priority:   Routine    Referral Type:   Consultation    Referred to Provider:   Glyn Adeeague Clark, Scot JunKaren E, PA-C    Number of Visits Requested:   1  . ABO/Rh    Standing Status:   Standing    Number of Occurrences:   1  . Discharge patient    Order Specific Question:   Discharge disposition    Answer:   01-Home or Self Care [1]    Order Specific Question:   Discharge patient date    Answer:   05/24/2020   No orders of the defined types were placed in this encounter.   Assessment and Plan   1. Intrauterine pregnancy   2. Vaginal bleeding in pregnancy   3. Blood type, Rh positive   4. Elevated liver enzymes   5. [redacted] weeks gestation of pregnancy   6. Migraine with status migrainosus, not intractable, unspecified migraine type     Allergies as of 05/24/2020      Reactions   Eggs Or Egg-derived  Products    Peanuts [peanut Oil]    Penicillins Rash      Medication List    TAKE these medications   gabapentin 100 MG capsule Commonly known as: NEURONTIN Take 1 capsule (100 mg total) by mouth 3 (three) times daily.   Magnesium 500 MG Caps 500mg  daily for migraine prevention   ondansetron 4 MG tablet Commonly known as: Zofran Take 1 tablet (4 mg total) by mouth every 8 (eight) hours as needed for nausea or vomiting.   SUMAtriptan 25 MG tablet Commonly known as: IMITREX Take 1 tablet (25 mg total) by mouth every 2 (two) hours as needed for migraine. May repeat in 2 hours if headache persists or recurs.       -will call with culture results, if positive -discussed s/sx of worsening liver function and when to present to ED -advised to limit use of Tylenol given elevation in liver enzymes -pt reports she took herself off of migraine medicine after finding out she was pregnant, referral to headache clinic made -pt reports daily alcohol use prior to pregnancy, but stopped on 05/07/2020 when she found out she was pregnant -pt to keep NOB appt as planned -return MAU precautions given -pt discharged to home in stable condition  Joni Reiningicole E Hester Forget 05/24/2020, 6:50 PM

## 2020-05-25 LAB — GC/CHLAMYDIA PROBE AMP (~~LOC~~) NOT AT ARMC
Chlamydia: NEGATIVE
Comment: NEGATIVE
Comment: NORMAL
Neisseria Gonorrhea: NEGATIVE

## 2020-05-26 LAB — CULTURE, OB URINE

## 2020-05-30 ENCOUNTER — Telehealth (INDEPENDENT_AMBULATORY_CARE_PROVIDER_SITE_OTHER): Payer: BC Managed Care – PPO | Admitting: *Deleted

## 2020-05-30 ENCOUNTER — Other Ambulatory Visit: Payer: Self-pay

## 2020-05-30 DIAGNOSIS — J4520 Mild intermittent asthma, uncomplicated: Secondary | ICD-10-CM

## 2020-05-30 DIAGNOSIS — R748 Abnormal levels of other serum enzymes: Secondary | ICD-10-CM | POA: Insufficient documentation

## 2020-05-30 DIAGNOSIS — O209 Hemorrhage in early pregnancy, unspecified: Secondary | ICD-10-CM

## 2020-05-30 DIAGNOSIS — G43819 Other migraine, intractable, without status migrainosus: Secondary | ICD-10-CM

## 2020-05-30 DIAGNOSIS — R8271 Bacteriuria: Secondary | ICD-10-CM

## 2020-05-30 DIAGNOSIS — O99519 Diseases of the respiratory system complicating pregnancy, unspecified trimester: Secondary | ICD-10-CM

## 2020-05-30 DIAGNOSIS — O99891 Other specified diseases and conditions complicating pregnancy: Secondary | ICD-10-CM

## 2020-05-30 DIAGNOSIS — J45909 Unspecified asthma, uncomplicated: Secondary | ICD-10-CM | POA: Insufficient documentation

## 2020-05-30 DIAGNOSIS — Z3490 Encounter for supervision of normal pregnancy, unspecified, unspecified trimester: Secondary | ICD-10-CM

## 2020-05-30 DIAGNOSIS — O0990 Supervision of high risk pregnancy, unspecified, unspecified trimester: Secondary | ICD-10-CM

## 2020-05-30 DIAGNOSIS — G43909 Migraine, unspecified, not intractable, without status migrainosus: Secondary | ICD-10-CM

## 2020-05-30 HISTORY — DX: Bacteriuria: R82.71

## 2020-05-30 HISTORY — DX: Hemorrhage in early pregnancy, unspecified: O20.9

## 2020-05-30 NOTE — Progress Notes (Signed)
11:27 Gizell not available virtually for her visit. I called her home/ mobile number and left a message I was calling for her virtual visit and will call again in a few minutes; please be available for your visit. Nixie Laube,RN 11:30 I called Mieshia again and she answered. She confirmed her DOB and name. I explained we would like to do visit virtually. I explained how to download MyChart app and that we would recconnect virtually We reconnected virtually. She reconfirmed her name/DOB. I explained I am completing her New OB Intake today. We discussed Her EDD and that it is based on early [place] due to unknown LMP . I reviewed her allergies, meds, OB History, Medical /Surgical history, and appropriate screenings. I informed her of [facility] services. She is G2P1001 with elevated liver enzymes, history of asthma, migraines.    I explained I will send her the [employer] app and app was sent to her while on phone. She will download after our virtual visit.    I explained we will ask her to take her blood pressure weekly during her pregnancy. We discussed since [employer] is her primary insurance and they do not cover bp cuff RX - I cannot send RX . I asked if she can buy a blood presure cuff to use.   I asked her if she can buy a cuff  to bring the blood pressure cuff with her to her first ob appointment so we can show her how to use it. Explained  then we will have her take her blood pressure weekly and enter into the app.  I explained she will have some visits in office and some virtually. She already has Sports coach. I reviewed her new ob  appointment date/ time with her , our location and to wear mask, one visitor.  I explained she will have a pelvic exam, ob bloodwork, hemoglobin a1C, cbg ,pap, and  genetic testing if desired,- she does want a panorama. I explained I will  scheduled an [place] at 19 weeks and she will be able to see it in MyChart. She voices understanding.  Darvell Monteforte,RN.

## 2020-05-31 NOTE — Progress Notes (Signed)
Patient seen and assessed by nursing staff.  Agree with documentation and plan.  

## 2020-06-08 ENCOUNTER — Telehealth: Payer: Self-pay | Admitting: Radiology

## 2020-06-08 NOTE — Telephone Encounter (Signed)
Called and left voicemail regarding referral for Deanna Patterson for scheduling.

## 2020-06-13 ENCOUNTER — Encounter: Payer: BC Managed Care – PPO | Admitting: Registered Nurse

## 2020-06-14 ENCOUNTER — Inpatient Hospital Stay (HOSPITAL_COMMUNITY)
Admission: AD | Admit: 2020-06-14 | Discharge: 2020-06-14 | Discharge status: Against Medical Advice | Payer: BC Managed Care – PPO | Attending: Obstetrics and Gynecology | Admitting: Obstetrics and Gynecology

## 2020-06-14 ENCOUNTER — Inpatient Hospital Stay (HOSPITAL_COMMUNITY): Payer: BC Managed Care – PPO

## 2020-06-14 ENCOUNTER — Encounter (HOSPITAL_COMMUNITY): Payer: Self-pay | Admitting: Obstetrics and Gynecology

## 2020-06-14 ENCOUNTER — Other Ambulatory Visit: Payer: Self-pay

## 2020-06-14 DIAGNOSIS — J45909 Unspecified asthma, uncomplicated: Secondary | ICD-10-CM | POA: Diagnosis not present

## 2020-06-14 DIAGNOSIS — R748 Abnormal levels of other serum enzymes: Secondary | ICD-10-CM | POA: Diagnosis not present

## 2020-06-14 DIAGNOSIS — O209 Hemorrhage in early pregnancy, unspecified: Secondary | ICD-10-CM | POA: Insufficient documentation

## 2020-06-14 DIAGNOSIS — O98811 Other maternal infectious and parasitic diseases complicating pregnancy, first trimester: Secondary | ICD-10-CM | POA: Diagnosis not present

## 2020-06-14 DIAGNOSIS — Z3A09 9 weeks gestation of pregnancy: Secondary | ICD-10-CM | POA: Insufficient documentation

## 2020-06-14 DIAGNOSIS — O219 Vomiting of pregnancy, unspecified: Secondary | ICD-10-CM | POA: Insufficient documentation

## 2020-06-14 DIAGNOSIS — N930 Postcoital and contact bleeding: Secondary | ICD-10-CM

## 2020-06-14 DIAGNOSIS — O0991 Supervision of high risk pregnancy, unspecified, first trimester: Secondary | ICD-10-CM | POA: Insufficient documentation

## 2020-06-14 DIAGNOSIS — O26891 Other specified pregnancy related conditions, first trimester: Secondary | ICD-10-CM | POA: Diagnosis not present

## 2020-06-14 DIAGNOSIS — O26611 Liver and biliary tract disorders in pregnancy, first trimester: Secondary | ICD-10-CM | POA: Diagnosis not present

## 2020-06-14 DIAGNOSIS — R8271 Bacteriuria: Secondary | ICD-10-CM | POA: Diagnosis not present

## 2020-06-14 DIAGNOSIS — Z880 Allergy status to penicillin: Secondary | ICD-10-CM | POA: Diagnosis not present

## 2020-06-14 DIAGNOSIS — O99511 Diseases of the respiratory system complicating pregnancy, first trimester: Secondary | ICD-10-CM | POA: Diagnosis not present

## 2020-06-14 DIAGNOSIS — Z79899 Other long term (current) drug therapy: Secondary | ICD-10-CM | POA: Insufficient documentation

## 2020-06-14 DIAGNOSIS — O99891 Other specified diseases and conditions complicating pregnancy: Secondary | ICD-10-CM

## 2020-06-14 DIAGNOSIS — O0990 Supervision of high risk pregnancy, unspecified, unspecified trimester: Secondary | ICD-10-CM

## 2020-06-14 LAB — COMPREHENSIVE METABOLIC PANEL
ALT: 35 U/L (ref 0–44)
AST: 24 U/L (ref 15–41)
Albumin: 3.5 g/dL (ref 3.5–5.0)
Alkaline Phosphatase: 46 U/L (ref 38–126)
Anion gap: 8 (ref 5–15)
BUN: 6 mg/dL (ref 6–20)
CO2: 23 mmol/L (ref 22–32)
Calcium: 9.4 mg/dL (ref 8.9–10.3)
Chloride: 106 mmol/L (ref 98–111)
Creatinine, Ser: 0.59 mg/dL (ref 0.44–1.00)
GFR calc Af Amer: >60 mL/min (ref >60)
GFR calc non Af Amer: >60 mL/min (ref >60)
Glucose, Bld: 107 mg/dL — ABNORMAL HIGH (ref 70–99)
Potassium: 4 mmol/L (ref 3.5–5.1)
Sodium: 137 mmol/L (ref 135–145)
Total Bilirubin: 0.7 mg/dL (ref 0.3–1.2)
Total Protein: 6.6 g/dL (ref 6.5–8.1)

## 2020-06-14 LAB — URINALYSIS, ROUTINE W REFLEX MICROSCOPIC
Bilirubin Urine: NEGATIVE
Glucose, UA: NEGATIVE mg/dL
Ketones, ur: NEGATIVE mg/dL
Leukocytes,Ua: NEGATIVE
Nitrite: NEGATIVE
Protein, ur: NEGATIVE mg/dL
Specific Gravity, Urine: 1.016 (ref 1.005–1.030)
pH: 7 (ref 5.0–8.0)

## 2020-06-14 MED ORDER — VITAMIN B-6 50 MG PO TABS
50.0000 mg | ORAL_TABLET | Freq: Two times a day (BID) | ORAL | 2 refills | Status: DC
Start: 2020-06-14 — End: 2020-06-25

## 2020-06-14 NOTE — Progress Notes (Deleted)
Marie Williams CNM in to discuss test results and d/c plan. Written and verbal d/c instructions given and understanding voiced.  

## 2020-06-14 NOTE — Discharge Instructions (Signed)
Vaginal Bleeding During Pregnancy, First Trimester  A small amount of bleeding from the vagina (spotting) is relatively common during early pregnancy. It usually stops on its own. Various things may cause bleeding or spotting during early pregnancy. Some bleeding may be related to the pregnancy, and some may not. In many cases, the bleeding is normal and is not a problem. However, bleeding can also be a sign of something serious. Be sure to tell your health care provider about any vaginal bleeding right away. Some possible causes of vaginal bleeding during the first trimester include:  Infection or inflammation of the cervix.  Growths (polyps) on the cervix.  Miscarriage or threatened miscarriage.  Pregnancy tissue developing outside of the uterus (ectopic pregnancy).  A mass of tissue developing in the uterus due to an egg being fertilized incorrectly (molar pregnancy). Follow these instructions at home: Activity  Follow instructions from your health care provider about limiting your activity. Ask what activities are safe for you.  If needed, make plans for someone to help with your regular activities.  Do not have sex or orgasms until your health care provider says that this is safe. General instructions  Take over-the-counter and prescription medicines only as told by your health care provider.  Pay attention to any changes in your symptoms.  Do not use tampons or douche.  Write down how many pads you use each day, how often you change pads, and how soaked (saturated) they are.  If you pass any tissue from your vagina, save the tissue so you can show it to your health care provider.  Keep all follow-up visits as told by your health care provider. This is important. Contact a health care provider if:  You have vaginal bleeding during any part of your pregnancy.  You have cramps or labor pains.  You have a fever. Get help right away if:  You have severe cramps in your  back or abdomen.  You pass large clots or a large amount of tissue from your vagina.  Your bleeding increases.  You feel light-headed or weak, or you faint.  You have chills.  You are leaking fluid or have a gush of fluid from your vagina. Summary  A small amount of bleeding (spotting) from the vagina is relatively common during early pregnancy.  Various things may cause bleeding or spotting in early pregnancy.  Be sure to tell your health care provider about any vaginal bleeding right away. This information is not intended to replace advice given to you by your health care provider. Make sure you discuss any questions you have with your health care provider. Document Revised: 03/01/2019 Document Reviewed: 02/12/2017 Elsevier Patient Education  2020 [employer]  Morning Sickness  Morning sickness is when you feel sick to your stomach (nauseous) during pregnancy. You may feel sick to your stomach and throw up (vomit). You may feel sick in the morning, but you can feel this way at any time of day. Some women feel very sick to their stomach and cannot stop throwing up (hyperemesis gravidarum). Follow these instructions at home: Medicines  Take over-the-counter and prescription medicines only as told by your doctor. Do not take any medicines until you talk with your doctor about them first.  Taking multivitamins before getting pregnant can stop or lessen the harshness of morning sickness. Eating and drinking  Eat dry toast or crackers before getting out of bed.  Eat 5 or 6 small meals a day.  Eat dry and bland foods  like rice and baked potatoes.  Do not eat greasy, fatty, or spicy foods.  Have someone cook for you if the smell of food causes you to feel sick or throw up.  If you feel sick to your stomach after taking prenatal vitamins, take them at night or with a snack.  Eat protein when you need a snack. Nuts, yogurt, and cheese are good choices.  Drink fluids  throughout the day.  Try ginger ale made with real ginger, ginger tea made from fresh grated ginger, or ginger candies. General instructions  Do not use any products that have nicotine or tobacco in them, such as cigarettes and e-cigarettes. If you need help quitting, ask your doctor.  Use an air purifier to keep the air in your house free of smells.  Get lots of fresh air.  Try to avoid smells that make you feel sick.  Try: ? Wearing a bracelet that is used for seasickness (acupressure wristband). ? Going to a doctor who puts thin needles into certain body points (acupuncture) to improve how you feel. Contact a doctor if:  You need medicine to feel better.  You feel dizzy or light-headed.  You are losing weight. Get help right away if:  You feel very sick to your stomach and cannot stop throwing up.  You pass out (faint).  You have very bad pain in your belly. Summary  Morning sickness is when you feel sick to your stomach (nauseous) during pregnancy.  You may feel sick in the morning, but you can feel this way at any time of day.  Making some changes to what you eat may help your symptoms go away. This information is not intended to replace advice given to you by your health care provider. Make sure you discuss any questions you have with your health care provider. Document Revised: 10/23/2017 Document Reviewed: 12/11/2016 Elsevier Patient Education  2020 [employer].

## 2020-06-14 NOTE — MAU Note (Signed)
Had some cramping in lower back and abdomen about 2100. Awoke at 0100 and went to BR and had gush of blood but no pain.

## 2020-06-14 NOTE — MAU Note (Signed)
Went in to ck on pt and room empty. Artelia Laroche CNM states she had gone in to check on her and pt had gone. CNM tried to call pt with lab results and went to VM

## 2020-06-14 NOTE — MAU Provider Note (Signed)
Chief Complaint: No chief complaint on file.   First Provider Initiated Contact with Patient 06/14/20 0220        SUBJECTIVE HPI: Deanna Patterson is a 24 y.o. G2P1001 at [redacted]w[redacted]d by LMP who presents to maternity admissions reporting gush of blood x 1, preceded by a single strong cramp.  After evaluation was completed, FOB mentioned they had intercourse immendiately before this.  Also c/o persistent nausea and vomiting which was suddenly better today. Pt states she was eating differently today.  Reports she had elevated transaminases in past.  Hepatitis workup negative but never had RUQ imaging. She denies vaginal itching/burning, urinary symptoms, h/a, dizziness, or fever/chills.    Vaginal Bleeding The patient's primary symptoms include pelvic pain (once) and vaginal bleeding (once, now resolved). The patient's pertinent negatives include no genital itching, genital lesions or genital odor. This is a new problem. The current episode started today. The problem occurs rarely. The problem has been resolved. The patient is experiencing no pain. She is pregnant. Pertinent negatives include no chills, constipation, diarrhea, fever, frequency, nausea or vomiting. The vaginal discharge was bloody. The vaginal bleeding is typical of menses. She has not been passing clots. She has not been passing tissue. The symptoms are aggravated by intercourse. She has tried nothing for the symptoms.   RN Note: Had some cramping in lower back and abdomen about 2100. Awoke at 0100 and went to BR and had gush of blood but no pain  Past Medical History:  Diagnosis Date  . Asthma   . Migraines    Past Surgical History:  Procedure Laterality Date  . NO PAST SURGERIES     Social History   Socioeconomic History  . Marital status: Single    Spouse name: Not on file  . Number of children: Not on file  . Years of education: Not on file  . Highest education level: Not on file  Occupational History  . Not on file   Tobacco Use  . Smoking status: Never Smoker  . Smokeless tobacco: Never Used  Vaping Use  . Vaping Use: Never used  Substance and Sexual Activity  . Alcohol use: Not Currently    Alcohol/week: 0.0 standard drinks    Comment: wine daily until pregnancy  . Drug use: No  . Sexual activity: Yes    Birth control/protection: None  Other Topics Concern  . Not on file  Social History Narrative  . Not on file   Social Determinants of Health   Financial Resource Strain:   . Difficulty of Paying Living Expenses:   Food Insecurity: Food Insecurity Present  . Worried About Programme researcher, broadcasting/film/video in the Last Year: Sometimes true  . Ran Out of Food in the Last Year: Sometimes true  Transportation Needs: No Transportation Needs  . Lack of Transportation (Medical): No  . Lack of Transportation (Non-Medical): No  Physical Activity:   . Days of Exercise per Week:   . Minutes of Exercise per Session:   Stress:   . Feeling of Stress :   Social Connections:   . Frequency of Communication with Friends and Family:   . Frequency of Social Gatherings with Friends and Family:   . Attends Religious Services:   . Active Member of Clubs or Organizations:   . Attends Banker Meetings:   Marland Kitchen Marital Status:   Intimate Partner Violence:   . Fear of Current or Ex-Partner:   . Emotionally Abused:   Marland Kitchen Physically Abused:   .  Sexually Abused:    No current facility-administered medications on file prior to encounter.   Current Outpatient Medications on File Prior to Encounter  Medication Sig Dispense Refill  . Prenatal Vit-Fe Fumarate-FA (PRENATAL VITAMIN PO) Take 1 tablet by mouth daily.    Marland Kitchen gabapentin (NEURONTIN) 100 MG capsule Take 1 capsule (100 mg total) by mouth 3 (three) times daily. (Patient not taking: Reported on 05/07/2020) 30 capsule 3  . Magnesium 500 MG CAPS 500mg  daily for migraine prevention (Patient not taking: Reported on 05/30/2020) 30 capsule 1  . ondansetron (ZOFRAN) 4 MG  tablet Take 1 tablet (4 mg total) by mouth every 8 (eight) hours as needed for nausea or vomiting. (Patient not taking: Reported on 05/30/2020) 20 tablet 0  . SUMAtriptan (IMITREX) 25 MG tablet Take 1 tablet (25 mg total) by mouth every 2 (two) hours as needed for migraine. May repeat in 2 hours if headache persists or recurs. (Patient not taking: Reported on 05/30/2020) 10 tablet 3   Allergies  Allergen Reactions  . Peanut-Containing Drug Products Rash, Shortness Of Breath and Swelling  . Eggs Or Egg-Derived Products   . Peanuts [Peanut Oil]   . Penicillins Rash    I have reviewed patient's Past Medical Hx, Surgical Hx, Family Hx, Social Hx, medications and allergies.   ROS:  Review of Systems  Constitutional: Negative for chills and fever.  Respiratory: Negative for shortness of breath.   Gastrointestinal: Negative for constipation, diarrhea, nausea and vomiting.  Genitourinary: Positive for pelvic pain (once) and vaginal bleeding. Negative for frequency.  Neurological: Negative for dizziness.   Review of Systems  Other systems negative   Physical Exam  Physical Exam Patient Vitals for the past 24 hrs:  BP Temp Pulse Resp Height Weight  06/14/20 0147 127/75 -- 84 -- -- --  06/14/20 0146 -- 98.7 F (37.1 C) -- 18 5' (1.524 m) 71.2 kg   Constitutional: Well-developed, well-nourished female in no acute distress.  Cardiovascular: normal rate Respiratory: normal effort GI: Abd soft, non-tender. MS: Extremities nontender, no edema, normal ROM Neurologic: Alert and oriented x 4.  GU: Neg CVAT.  PELVIC EXAM: deferred in lieu of ultrasound                           No further bleeding since one event                           No further pain  LAB RESULTS Results for orders placed or performed during the hospital encounter of 06/14/20 (from the past 24 hour(s))  Urinalysis, Routine w reflex microscopic     Status: Abnormal   Collection Time: 06/14/20  1:55 AM  Result Value Ref  Range   Color, Urine YELLOW YELLOW   APPearance CLEAR CLEAR   Specific Gravity, Urine 1.016 1.005 - 1.030   pH 7.0 5.0 - 8.0   Glucose, UA NEGATIVE NEGATIVE mg/dL   Hgb urine dipstick MODERATE (A) NEGATIVE   Bilirubin Urine NEGATIVE NEGATIVE   Ketones, ur NEGATIVE NEGATIVE mg/dL   Protein, ur NEGATIVE NEGATIVE mg/dL   Nitrite NEGATIVE NEGATIVE   Leukocytes,Ua NEGATIVE NEGATIVE   RBC / HPF 0-5 0 - 5 RBC/hpf   WBC, UA 0-5 0 - 5 WBC/hpf   Bacteria, UA RARE (A) NONE SEEN   Squamous Epithelial / LPF 0-5 0 - 5   Mucus PRESENT   Comprehensive metabolic panel  Status: Abnormal   Collection Time: 06/14/20  3:48 AM  Result Value Ref Range   Sodium 137 135 - 145 mmol/L   Potassium 4.0 3.5 - 5.1 mmol/L   Chloride 106 98 - 111 mmol/L   CO2 23 22 - 32 mmol/L   Glucose, Bld 107 (H) 70 - 99 mg/dL   BUN 6 6 - 20 mg/dL   Creatinine, Ser 6.28 0.44 - 1.00 mg/dL   Calcium 9.4 8.9 - 31.5 mg/dL   Total Protein 6.6 6.5 - 8.1 g/dL   Albumin 3.5 3.5 - 5.0 g/dL   AST 24 15 - 41 U/L   ALT 35 0 - 44 U/L   Alkaline Phosphatase 46 38 - 126 U/L   Total Bilirubin 0.7 0.3 - 1.2 mg/dL   GFR calc non Af Amer >60 >60 mL/min   GFR calc Af Amer >60 >60 mL/min   Anion gap 8 5 - 15   --/--/A POS (07/01 1603)  IMAGING US OB Comp Less 14 Wks  Result Date: 06/14/2020 CLINICAL DATA:  Bleeding EXAM: OBSTETRIC <14 WK ULTRASOUND TECHNIQUE: Transabdominal ultrasound was performed for evaluation of the gestation as well as the maternal uterus and adnexal regions. COMPARISON:  May 24, 2020 FINDINGS: Intrauterine gestational sac: Single Yolk sac:  Visualized. Embryo:  Visualized. Cardiac Activity: Visualized. Heart Rate: 161 bpm CRL:   22.9 mm   8 w 6 d                  [place] EDC: 01/18/2021 Subchorionic hemorrhage:  None visualized. Maternal uterus/adnexae: There is no significant maternal abnormality. The ovaries are unremarkable. IMPRESSION: Single live IUP at 8 weeks and 6 days Electronically Signed   By: Katherine Mantle M.D.   On: 06/14/2020 03:00    MAU Management/MDM: Ordered Ultrasound to rule out SAB Results reviewed which were reassuring.  No evidence of SCH. After reviewing this, FOB mentions that they had intercourse prior to bleeding incident  Also c/o sudden improvement of nausea and vomiting today States eating differently Discussed may need to modify diet Not taking antiemetics due to elevated transaminases  Hepatitis panel was negative Has never had imaging of RUQ to rule out fatty liver or cholelithiasis There is also mention in records of past alcohol use before pregnancy.  No records of CMET prior to this year for comparison     May order imaging to start with as outpatient.  Will recheck CMET tonight while here  AST and ALT are now within normal limits   I went to tell patient her results and she was no longer in her room    I tried calling her and got no answer.   ASSESSMENT 1. Supervision of high risk pregnancy, antepartum   2. Vaginal bleeding in pregnancy, first trimester   3. Antepartum asymptomatic bacteriuria   4. Elevated liver enzymes     PLAN Discharge home Pelvic rest until no bleeding for 1-2 weeks  Pt stable at time of discharge. Encouraged to return here or to other Urgent Care/ED if she develops worsening of symptoms, increase in pain, fever, or other concerning symptoms.    Wynelle Bourgeois CNM, MSN Certified Nurse-Midwife 06/14/2020  2:20 AM

## 2020-06-15 ENCOUNTER — Ambulatory Visit: Payer: BC Managed Care – PPO | Admitting: Registered Nurse

## 2020-06-21 ENCOUNTER — Ambulatory Visit: Payer: BC Managed Care – PPO

## 2020-06-25 ENCOUNTER — Inpatient Hospital Stay (HOSPITAL_COMMUNITY)
Admission: AD | Admit: 2020-06-25 | Discharge: 2020-06-25 | Disposition: A | Discharge status: Home / Self Care | Payer: BC Managed Care – PPO | Attending: Obstetrics & Gynecology | Admitting: Obstetrics & Gynecology

## 2020-06-25 ENCOUNTER — Encounter (HOSPITAL_COMMUNITY): Payer: Self-pay | Admitting: Obstetrics & Gynecology

## 2020-06-25 DIAGNOSIS — Z3A10 10 weeks gestation of pregnancy: Secondary | ICD-10-CM | POA: Diagnosis not present

## 2020-06-25 DIAGNOSIS — Z79899 Other long term (current) drug therapy: Secondary | ICD-10-CM | POA: Diagnosis not present

## 2020-06-25 DIAGNOSIS — Z880 Allergy status to penicillin: Secondary | ICD-10-CM | POA: Diagnosis not present

## 2020-06-25 DIAGNOSIS — O99511 Diseases of the respiratory system complicating pregnancy, first trimester: Secondary | ICD-10-CM | POA: Diagnosis not present

## 2020-06-25 DIAGNOSIS — O219 Vomiting of pregnancy, unspecified: Secondary | ICD-10-CM | POA: Insufficient documentation

## 2020-06-25 DIAGNOSIS — J45909 Unspecified asthma, uncomplicated: Secondary | ICD-10-CM | POA: Insufficient documentation

## 2020-06-25 DIAGNOSIS — O209 Hemorrhage in early pregnancy, unspecified: Secondary | ICD-10-CM | POA: Diagnosis present

## 2020-06-25 DIAGNOSIS — O4691 Antepartum hemorrhage, unspecified, first trimester: Secondary | ICD-10-CM

## 2020-06-25 LAB — WET PREP, GENITAL
Clue Cells Wet Prep HPF POC: NONE SEEN
Sperm: NONE SEEN
Trich, Wet Prep: NONE SEEN
Yeast Wet Prep HPF POC: NONE SEEN

## 2020-06-25 LAB — URINALYSIS, ROUTINE W REFLEX MICROSCOPIC
Bacteria, UA: NONE SEEN
Bilirubin Urine: NEGATIVE
Glucose, UA: NEGATIVE mg/dL
Ketones, ur: NEGATIVE mg/dL
Leukocytes,Ua: NEGATIVE
Nitrite: NEGATIVE
Protein, ur: NEGATIVE mg/dL
RBC / HPF: >50 RBC/hpf — ABNORMAL HIGH (ref 0–5)
Specific Gravity, Urine: 1.014 (ref 1.005–1.030)
pH: 6 (ref 5.0–8.0)

## 2020-06-25 MED ORDER — METOCLOPRAMIDE HCL 10 MG PO TABS: 10.0000 mg | ORAL_TABLET | Freq: Three times a day (TID) | ORAL | 0 refills | Status: DC | PRN

## 2020-06-25 NOTE — MAU Note (Signed)
. °  Deanna Patterson is a 24 y.o. at [redacted]w[redacted]d here in MAU reporting: vaginal bleeding after intercourse today. Denies any pain at present.  Onset of complaint: today Pain score: 0 Vitals:   06/25/20 1724  BP: 130/78  Pulse: 86  Resp: 18  Temp: 98.6 F (37 C)     FHT: Lab orders placed from triage: UA

## 2020-06-25 NOTE — Discharge Instructions (Signed)
Safe Medications in Pregnancy   Acne: Benzoyl Peroxide Salicylic Acid  Backache/Headache: Tylenol: 2 regular strength every 4 hours OR              2 Extra strength every 6 hours  Colds/Coughs/Allergies: Benadryl (alcohol free) 25 mg every 6 hours as needed Breath right strips Claritin Cepacol throat lozenges Chloraseptic throat spray Cold-Eeze- up to three times per day Cough drops, alcohol free Flonase (by prescription only) Guaifenesin Mucinex Robitussin DM (plain only, alcohol free) Saline nasal spray/drops Sudafed (pseudoephedrine) & Actifed ** use only after [redacted] weeks gestation and if you do not have high blood pressure Tylenol Vicks Vaporub Zinc lozenges Zyrtec   Constipation: Colace Ducolax suppositories Fleet enema Glycerin suppositories Metamucil Milk of magnesia Miralax Senokot Smooth move tea  Diarrhea: Kaopectate Imodium A-D  *NO pepto Bismol  Hemorrhoids: Anusol Anusol HC Preparation H Tucks  Indigestion: Tums Maalox Mylanta Zantac  Pepcid  Insomnia: Benadryl (alcohol free) 25mg  every 6 hours as needed Tylenol PM Unisom, no Gelcaps  Leg Cramps: Tums MagGel  Nausea/Vomiting:  Bonine Dramamine Emetrol Ginger extract Sea bands Meclizine  Nausea medication to take during pregnancy:  Unisom (doxylamine succinate 25 mg tablets) Take one tablet daily at bedtime. If symptoms are not adequately controlled, the dose can be increased to a maximum recommended dose of two tablets daily (1/2 tablet in the morning, 1/2 tablet mid-afternoon and one at bedtime). Vitamin B6 100mg  tablets. Take one tablet twice a day (up to 200 mg per day).  Skin Rashes: Aveeno products Benadryl cream or 25mg  every 6 hours as needed Calamine Lotion 1% cortisone cream  Yeast infection: Gyne-lotrimin 7 Monistat 7  Gum/tooth pain: Anbesol  **If taking multiple medications, please check labels to avoid duplicating the same active ingredients **take  medication as directed on the label ** Do not exceed 4000 mg of tylenol in 24 hours **Do not take medications that contain aspirin or ibuprofen     Vaginal Bleeding During Pregnancy, First Trimester  A small amount of bleeding from the vagina (spotting) is relatively common during early pregnancy. It usually stops on its own. Various things may cause bleeding or spotting during early pregnancy. Some bleeding may be related to the pregnancy, and some may not. In many cases, the bleeding is normal and is not a problem. However, bleeding can also be a sign of something serious. Be sure to tell your health care provider about any vaginal bleeding right away. Some possible causes of vaginal bleeding during the first trimester include:  Infection or inflammation of the cervix.  Growths (polyps) on the cervix.  Miscarriage or threatened miscarriage.  Pregnancy tissue developing outside of the uterus (ectopic pregnancy).  A mass of tissue developing in the uterus due to an egg being fertilized incorrectly (molar pregnancy). Follow these instructions at home: Activity  Follow instructions from your health care provider about limiting your activity. Ask what activities are safe for you.  If needed, make plans for someone to help with your regular activities.  Do not have sex or orgasms until your health care provider says that this is safe. General instructions  Take over-the-counter and prescription medicines only as told by your health care provider.  Pay attention to any changes in your symptoms.  Do not use tampons or douche.  Write down how many pads you use each day, how often you change pads, and how soaked (saturated) they are.  If you pass any tissue from your vagina, save the tissue so you  can show it to your health care provider.  Keep all follow-up visits as told by your health care provider. This is important. Contact a health care provider if:  You have vaginal  bleeding during any part of your pregnancy.  You have cramps or labor pains.  You have a fever. Get help right away if:  You have severe cramps in your back or abdomen.  You pass large clots or a large amount of tissue from your vagina.  Your bleeding increases.  You feel light-headed or weak, or you faint.  You have chills.  You are leaking fluid or have a gush of fluid from your vagina. Summary  A small amount of bleeding (spotting) from the vagina is relatively common during early pregnancy.  Various things may cause bleeding or spotting in early pregnancy.  Be sure to tell your health care provider about any vaginal bleeding right away. This information is not intended to replace advice given to you by your health care provider. Make sure you discuss any questions you have with your health care provider. Document Revised: 03/01/2019 Document Reviewed: 02/12/2017 Elsevier Patient Education  2020 [employer].

## 2020-06-25 NOTE — MAU Provider Note (Signed)
Chief Complaint: Vaginal Bleeding   First Provider Initiated Contact with Patient 06/25/20 2001     SUBJECTIVE HPI: Deanna Patterson is a 24 y.o. G2P1001 at [redacted]w[redacted]d who presents to M[facility] Admissions via EMS reporting vaginal bleeding.  Symptoms started this afternoon immediately after intercourse.  Reports bright red bleeding in her underwear and passed a large clot while in MAU.  Denies abdominal pain.  Denies any other symptoms currently.   Past Medical History:  Diagnosis Date   Asthma    Migraines    OB History  Gravida Para Term Preterm AB Living  2 1 1     1   SAB TAB Ectopic Multiple Live Births          1    # Outcome Date GA Lbr Len/2nd Weight Sex Delivery Anes PTL Lv  2 Current           1 Term 07/01/14 [redacted]w[redacted]d  2693 g F Vag-Spont EPI  LIV     Birth Comments: IOL because "FHR dropping"   Past Surgical History:  Procedure Laterality Date   NO PAST SURGERIES     Social History   Socioeconomic History   Marital status: Single    Spouse name: Not on file   Number of children: Not on file   Years of education: Not on file   Highest education level: Not on file  Occupational History   Not on file  Tobacco Use   Smoking status: Never Smoker   Smokeless tobacco: Never Used  Vaping Use   Vaping Use: Never used  Substance and Sexual Activity   Alcohol use: Not Currently    Alcohol/week: 0.0 standard drinks    Comment: wine daily until pregnancy   Drug use: No   Sexual activity: Yes    Birth control/protection: None  Other Topics Concern   Not on file  Social History Narrative   Not on file   Social Determinants of Health   Financial Resource Strain:    Difficulty of Paying Living Expenses:   Food Insecurity: Food Insecurity Present   Worried About Running Out of Food in the Last Year: Sometimes true   Ran Out of Food in the Last Year: Sometimes true  Transportation Needs: No Transportation Needs   Lack of Transportation (Medical): No    Lack of Transportation (Non-Medical): No  Physical Activity:    Days of Exercise per Week:    Minutes of Exercise per Session:   Stress:    Feeling of Stress :   Social Connections:    Frequency of Communication with Friends and Family:    Frequency of Social Gatherings with Friends and Family:    Attends Religious Services:    Active Member of Clubs or Organizations:    Attends [id number]:    Marital Status:   Intimate Partner Violence:    Fear of Current or Ex-Partner:    Emotionally Abused:    Physically Abused:    Sexually Abused:    Family History  Problem Relation Age of Onset   Hyperlipidemia Mother    Hypertension Mother    Thyroid disease Mother    Hyperlipidemia Maternal Grandmother    Hypertension Maternal Grandmother    No current facility-administered medications on file prior to encounter.   Current Outpatient Medications on File Prior to Encounter  Medication Sig Dispense Refill   Prenatal Vit-Fe Fumarate-FA (PRENATAL VITAMIN PO) Take 1 tablet by mouth daily.     Magnesium 500 MG CAPS 500mg   daily for migraine prevention (Patient not taking: Reported on 05/30/2020) 30 capsule 1   Allergies  Allergen Reactions   Peanut-Containing Drug Products Rash, Shortness Of Breath and Swelling   Eggs Or Egg-Derived Products    Peanuts [Peanut Oil]    Penicillins Rash    I have reviewed patient's Past Medical Hx, Surgical Hx, Family Hx, Social Hx, medications and allergies.   Review of Systems  Constitutional: Negative.   Gastrointestinal: Negative.   Genitourinary: Positive for vaginal bleeding.    OBJECTIVE Patient Vitals for the past 24 hrs:  BP Temp Pulse Resp Weight  06/25/20 2051 115/67 -- 85 -- --  06/25/20 1724 130/78 98.6 F (37 C) 86 18 71.2 kg   Constitutional: Well-developed, well-nourished female in no acute distress.  Cardiovascular: normal rate & rhythm, no murmur Respiratory: normal rate and effort.  Lung sounds clear throughout GI: Abd soft, non-tender, Pos BS x 4. No guarding or rebound tenderness MS: Extremities nontender, no edema, normal ROM Neurologic: Alert and oriented x 4.  GU:     SPECULUM EXAM: NEFG, small amount of dark red blood from os. No clots. Cervix not friable.   BIMANUAL: No CMT. cervix closed    LAB RESULTS Results for orders placed or performed during the hospital encounter of 06/25/20 (from the past 24 hour(s))  Urinalysis, Routine w reflex microscopic     Status: Abnormal   Collection Time: 06/25/20  6:15 PM  Result Value Ref Range   Color, Urine YELLOW YELLOW   APPearance CLEAR CLEAR   Specific Gravity, Urine 1.014 1.005 - 1.030   pH 6.0 5.0 - 8.0   Glucose, UA NEGATIVE NEGATIVE mg/dL   Hgb urine dipstick LARGE (A) NEGATIVE   Bilirubin Urine NEGATIVE NEGATIVE   Ketones, ur NEGATIVE NEGATIVE mg/dL   Protein, ur NEGATIVE NEGATIVE mg/dL   Nitrite NEGATIVE NEGATIVE   Leukocytes,Ua NEGATIVE NEGATIVE   RBC / HPF >50 (H) 0 - 5 RBC/hpf   WBC, UA 0-5 0 - 5 WBC/hpf   Bacteria, UA NONE SEEN NONE SEEN   Squamous Epithelial / LPF 0-5 0 - 5   Mucus PRESENT   Wet prep, genital     Status: Abnormal   Collection Time: 06/25/20  8:03 PM  Result Value Ref Range   Yeast Wet Prep HPF POC NONE SEEN NONE SEEN   Trich, Wet Prep NONE SEEN NONE SEEN   Clue Cells Wet Prep HPF POC NONE SEEN NONE SEEN   WBC, Wet Prep HPF POC FEW (A) NONE SEEN   Sperm NONE SEEN     IMAGING No results found.  MAU COURSE Orders Placed This Encounter  Procedures   Wet prep, genital   Urinalysis, Routine w reflex microscopic   Discharge patient   Meds ordered this encounter  Medications   metoCLOPramide (REGLAN) 10 MG tablet    Sig: Take 1 tablet (10 mg total) by mouth every 8 (eight) hours as needed for nausea (or headaches (take with tylenol for headache)).    Dispense:  30 tablet    Refill:  0    Order Specific Question:   Supervising Provider    Answer:   Adam Phenix  [[id number]]    MDM FHT present via doppler Passed large blood clot in toilet after doppler. BSUS performed for patient reassurance.  Pt informed that the ultrasound is considered a limited OB ultrasound and is not intended to be a complete ultrasound exam.  Patient also informed that the ultrasound is not being  completed with the intent of assessing for fetal or placental anomalies or any pelvic abnormalities.  Explained that the purpose of todays ultrasound is to assess for  viability.  Patient acknowledges the purpose of the exam and the limitations of the study.  Active IUP with FHR 164 bpm  RH positive Small amount of bleeding on exam. Cervix closed. Wet prep negative. Patient previously had SCH on ultrasound; likely cause of bleeding. Recommend pelvic rest.   Patient requesting rx & info for treatment of nausea & headaches (documented hx of migraines). Will rx reglan to take for nausea. Can also take reglan with tylenol for tx of headaches. Recommend restarting daily magnesium for h/a prevention.     ASSESSMENT 1. Vaginal bleeding in pregnancy, first trimester   2. [redacted] weeks gestation of pregnancy   3. Nausea and vomiting during pregnancy prior to [redacted] weeks gestation     PLAN Discharge home in stable condition. Rx reglan Discussed bleeding precautions & reasons to return to MAU GC/CT pending Pelvic rest   Allergies as of 06/25/2020      Reactions   Peanut-containing Drug Products Rash, Shortness Of Breath, Swelling   Eggs Or Egg-derived Products    Peanuts [peanut Oil]    Penicillins Rash      Medication List    STOP taking these medications   gabapentin 100 MG capsule Commonly known as: NEURONTIN   ondansetron 4 MG tablet Commonly known as: Zofran   pyridOXINE 50 MG tablet Commonly known as: VITAMIN B-6     TAKE these medications   Magnesium 500 MG Caps 500mg  daily for migraine prevention   metoCLOPramide 10 MG tablet Commonly known as: REGLAN Take 1 tablet (10 mg  total) by mouth every 8 (eight) hours as needed for nausea (or headaches (take with tylenol for headache)).   PRENATAL VITAMIN PO Take 1 tablet by mouth daily.        , NP 06/25/2020  10:24 PM

## 2020-06-26 LAB — GC/CHLAMYDIA PROBE AMP (~~LOC~~) NOT AT ARMC
Chlamydia: NEGATIVE
Comment: NEGATIVE
Comment: NORMAL
Neisseria Gonorrhea: NEGATIVE

## 2020-06-27 ENCOUNTER — Ambulatory Visit (INDEPENDENT_AMBULATORY_CARE_PROVIDER_SITE_OTHER): Payer: BC Managed Care – PPO | Admitting: Obstetrics and Gynecology

## 2020-06-27 ENCOUNTER — Encounter: Payer: Self-pay | Admitting: Obstetrics and Gynecology

## 2020-06-27 ENCOUNTER — Other Ambulatory Visit: Payer: Self-pay

## 2020-06-27 VITALS — BP 107/65 | HR 89 | Wt 154.1 lb

## 2020-06-27 DIAGNOSIS — O0990 Supervision of high risk pregnancy, unspecified, unspecified trimester: Secondary | ICD-10-CM

## 2020-06-27 DIAGNOSIS — R87610 Atypical squamous cells of undetermined significance on cytologic smear of cervix (ASC-US): Secondary | ICD-10-CM

## 2020-06-27 DIAGNOSIS — R8271 Bacteriuria: Secondary | ICD-10-CM

## 2020-06-27 DIAGNOSIS — O209 Hemorrhage in early pregnancy, unspecified: Secondary | ICD-10-CM

## 2020-06-27 LAB — POCT URINALYSIS DIP (DEVICE)
Bilirubin Urine: NEGATIVE
Glucose, UA: NEGATIVE mg/dL
Leukocytes,Ua: NEGATIVE
Nitrite: NEGATIVE
Protein, ur: NEGATIVE mg/dL
Specific Gravity, Urine: >=1.03 (ref 1.005–1.030)
Urobilinogen, UA: 2 mg/dL — ABNORMAL HIGH (ref 0.0–1.0)
pH: 6 (ref 5.0–8.0)

## 2020-06-27 MED ORDER — PRENATAL VITAMIN 27-0.8 MG PO TABS: 1.0000 | ORAL_TABLET | Freq: Every day | ORAL | 3 refills | Status: DC

## 2020-06-27 NOTE — Patient Instructions (Addendum)
-  It was a pleasure to meet you today! Please call if any questions or concerns. -We did your prenatal labs today. Your anatomy scan is already scheduled for 9/29. -Please continue to take your prenatal vitamin daily

## 2020-06-27 NOTE — Progress Notes (Signed)
PRENATAL VISIT NOTE  Subjective:  Deanna Patterson is a 24 y.o. G2P1001 at [redacted]w[redacted]d being seen today for ongoing prenatal care.  She is currently monitored for the following issues for this low-risk pregnancy and has Other fatigue; Intractable migraine with aura without status migrainosus; Vitamin D deficiency; Supervision of high risk pregnancy, antepartum; Vaginal bleeding in pregnancy, first trimester; GBS bacteriuria; Asthma; Elevated liver enzymes; and Atypical squamous cells of undetermined significance (ASCUS) on Papanicolaou smear of cervix on their problem list.   Pt presents with her husband today. No specific concerns at this time.  Of note, pt was seen in MAU on 8/2 for concern of vaginal bleeding, now resolved. [place] on 8/2 confirmed IUP. She reports good adherence to daily prenatal vitamins. Pt declines genetic testing today. Pt prefers to defer pap today given report of normal pap last year (history of ASCUS in 2018).  Patient reports no complaints.  Contractions: Not present. Vag. Bleeding: resolved as noted above. Denies leaking of fluid.  The following portions of the patient's history were reviewed and updated as appropriate: allergies, current medications, past family history, past medical history, past social history, past surgical history and problem list.   Objective:   Vitals:   06/27/20 1404  BP: 107/65  Pulse: 89  Weight: 154 lb 1.6 oz (69.9 kg)    Fetal Status: Fetal Heart Rate (bpm): 165         General:  Alert, oriented and cooperative. Patient is in no acute distress.  Skin: Skin is warm and dry. No rash noted.   Cardiovascular: Normal heart rate noted  Respiratory: Normal respiratory effort, no problems with respiration noted  Abdomen: Soft, gravid, appropriate for gestational age.  Pain/Pressure: Present     Pelvic: Cervical exam deferred        Extremities: Normal range of motion.  Edema: None  Mental Status: Normal mood and affect. Normal behavior. Normal  judgment and thought content.   Assessment and Plan:  Pregnancy: G2P1001 at [redacted]w[redacted]d by -/6. Prior pregnancy uncomplicated. Prior term NSVD in 2015 (pt reports IOL given concern of "dropping FHR"). Minimal vaginal bleeding prior but now resolved. Repeat US on 8/2 confirmed IUP with +FHTs.  1. Supervision of Normal Pregnancy: Prior ultrasounds at [redacted]w[redacted]d and [redacted]w[redacted]d showed IUP with +FHTs. No red flag signs/symptoms today. No mood or safety concerns. Supportive partner at visit today. No tobacco or substance use. Taking PNV daily with good adherence. - initial prenatal labs today; anatomy scan already scheduled - plan for f/u prenatal appt in 4 weeks or sooner as needed  2. History of ASCUS: Pt prefers to defer pap today given report of normal pap last year at outside clinic (history of ASCUS in 2018). - plan for pap at f/u appt if unable to obtain results of 2020 from outside clinic  3. GBS bacteriuria: Present in urine on 05/24/20 in MAU. Of note, pt allergic to penicillin (reaction: rash).  Preterm labor symptoms and general obstetric precautions including but not limited to vaginal bleeding, contractions, leaking of fluid and fetal movement were reviewed in detail with the patient. Please refer to After Visit Summary for other counseling recommendations.   Return in about 4 weeks (around 07/25/2020) for in person OB visit with Nichole Keltner.  Future Appointments  Date Time Provider Department Center  07/25/2020  2:10 PM Janeece Agee, NP [facility]  08/22/2020  8:45 AM [facility] NURSE [facility]  08/22/2020  9:00 AM [facility] US1 [facility]    Sheila Oats,  MD OB Fellow, Faculty Practice

## 2020-06-27 NOTE — Progress Notes (Signed)
NEW OB packet given  Home Medicaid Form completed  

## 2020-06-28 ENCOUNTER — Encounter: Payer: Self-pay | Admitting: *Deleted

## 2020-06-28 LAB — CBC/D/PLT+RPR+RH+ABO+RUB AB...
Antibody Screen: NEGATIVE
Basophils Absolute: 0.1 10*3/uL (ref 0.0–0.2)
Basos: 1 %
EOS (ABSOLUTE): 0.2 10*3/uL (ref 0.0–0.4)
Eos: 3 %
HCV Ab: <0.1 s/co ratio (ref 0.0–0.9)
HIV Screen 4th Generation wRfx: NONREACTIVE
Hematocrit: 30.7 % — ABNORMAL LOW (ref 34.0–46.6)
Hemoglobin: 10.7 g/dL — ABNORMAL LOW (ref 11.1–15.9)
Hepatitis B Surface Ag: NEGATIVE
Immature Grans (Abs): 0 10*3/uL (ref 0.0–0.1)
Immature Granulocytes: 0 %
Lymphocytes Absolute: 1.8 10*3/uL (ref 0.7–3.1)
Lymphs: 22 %
MCH: 32.1 pg (ref 26.6–33.0)
MCHC: 34.9 g/dL (ref 31.5–35.7)
MCV: 92 fL (ref 79–97)
Monocytes Absolute: 0.8 10*3/uL (ref 0.1–0.9)
Monocytes: 9 %
Neutrophils Absolute: 5.4 10*3/uL (ref 1.4–7.0)
Neutrophils: 65 %
Platelets: 313 10*3/uL (ref 150–450)
RBC: 3.33 x10E6/uL — ABNORMAL LOW (ref 3.77–5.28)
RDW: 12 % (ref 11.7–15.4)
RPR Ser Ql: NONREACTIVE
Rh Factor: POSITIVE
Rubella Antibodies, IGG: 1.37 index (ref >0.99)
WBC: 8.3 10*3/uL (ref 3.4–10.8)

## 2020-06-28 LAB — HCV INTERPRETATION

## 2020-06-28 LAB — CULTURE, OB URINE

## 2020-07-25 ENCOUNTER — Encounter: Payer: BC Managed Care – PPO | Admitting: Registered Nurse

## 2020-08-01 ENCOUNTER — Encounter: Payer: BC Managed Care – PPO | Admitting: Obstetrics and Gynecology

## 2020-08-09 ENCOUNTER — Other Ambulatory Visit: Payer: Self-pay

## 2020-08-09 ENCOUNTER — Ambulatory Visit (INDEPENDENT_AMBULATORY_CARE_PROVIDER_SITE_OTHER): Payer: BC Managed Care – PPO | Admitting: Obstetrics and Gynecology

## 2020-08-09 ENCOUNTER — Encounter: Payer: Self-pay | Admitting: Obstetrics and Gynecology

## 2020-08-09 DIAGNOSIS — R8271 Bacteriuria: Secondary | ICD-10-CM

## 2020-08-09 DIAGNOSIS — Z3A17 17 weeks gestation of pregnancy: Secondary | ICD-10-CM

## 2020-08-09 DIAGNOSIS — O0992 Supervision of high risk pregnancy, unspecified, second trimester: Secondary | ICD-10-CM | POA: Diagnosis not present

## 2020-08-09 DIAGNOSIS — O0990 Supervision of high risk pregnancy, unspecified, unspecified trimester: Secondary | ICD-10-CM

## 2020-08-09 NOTE — Progress Notes (Signed)
   PRENATAL VISIT NOTE  Subjective:  Deanna Patterson is a 24 y.o. G2P1001 at [redacted]w[redacted]d being seen today for ongoing prenatal care.  She is currently monitored for the following issues for this low-risk pregnancy and has Other fatigue; Intractable migraine with aura without status migrainosus; Vitamin D deficiency; Supervision of high risk pregnancy, antepartum; Vaginal bleeding in pregnancy, first trimester; GBS bacteriuria; Asthma; and Atypical squamous cells of undetermined significance (ASCUS) on Papanicolaou smear of cervix on their problem list.  Patient reports round ligament pain.   .  .   . Denies leaking of fluid.   The following portions of the patient's history were reviewed and updated as appropriate: allergies, current medications, past family history, past medical history, past social history, past surgical history and problem list.   Objective:   Vitals:   08/09/20 1505  BP: 112/73  Pulse: 80  Weight: 152 lb 11.2 oz (69.3 kg)    Fetal Status:           General:  Alert, oriented and cooperative. Patient is in no acute distress.  Skin: Skin is warm and dry. No rash noted.   Cardiovascular: Normal heart rate noted  Respiratory: Normal respiratory effort, no problems with respiration noted  Abdomen: Soft, gravid, appropriate for gestational age.        Pelvic: Cervical exam deferred        Extremities: Normal range of motion.     Mental Status: Normal mood and affect. Normal behavior. Normal judgment and thought content.   Assessment and Plan:  Pregnancy: G2P1001 at [redacted]w[redacted]d  1. GBS bacteriuria   2. Supervision of high risk pregnancy, antepartum  Doing well.  [place] scheduled. Fundal height 20   COVID-19 Vaccine Counseling: The patient was counseled on the potential benefits and lack of known risks of COVID vaccination, during pregnancy and breastfeeding, during today's visit. The patient's questions and concerns were addressed today, including safety of the vaccination and  potential side effects as they have been published by ACOG and [facility]. The patient has been informed that there have not been any documented vaccine related injuries, deaths or birth defects to infant or mom after receiving the COVID-19 vaccine to date. The patient has been made aware that although she is not at increased risk of contracting COVID-19 during pregnancy, she is at increased risk of developing severe disease and complications if she contracts COVID-19 while pregnant. All patient questions were addressed during our visit today. The patient is still unsure of her decision for vaccination.     Preterm labor symptoms and general obstetric precautions including but not limited to vaginal bleeding, contractions, leaking of fluid and fetal movement were reviewed in detail with the patient. Please refer to After Visit Summary for other counseling recommendations.   No follow-ups on file.  Future Appointments  Date Time Provider Department Center  08/22/2020  8:45 AM [facility] NURSE [facility]  08/22/2020  9:00 AM [facility] US1 [facility]    Venia Carbon, NP

## 2020-08-10 ENCOUNTER — Encounter: Payer: BC Managed Care – PPO | Admitting: Registered Nurse

## 2020-08-22 ENCOUNTER — Ambulatory Visit: Payer: BC Managed Care – PPO

## 2020-09-06 ENCOUNTER — Encounter: Payer: Self-pay | Admitting: Student

## 2020-09-06 ENCOUNTER — Ambulatory Visit (INDEPENDENT_AMBULATORY_CARE_PROVIDER_SITE_OTHER): Payer: BC Managed Care – PPO | Admitting: Student

## 2020-09-06 ENCOUNTER — Other Ambulatory Visit: Payer: Self-pay

## 2020-09-06 VITALS — BP 111/73 | HR 96 | Wt 153.5 lb

## 2020-09-06 DIAGNOSIS — Z3A21 21 weeks gestation of pregnancy: Secondary | ICD-10-CM

## 2020-09-06 DIAGNOSIS — O219 Vomiting of pregnancy, unspecified: Secondary | ICD-10-CM

## 2020-09-06 DIAGNOSIS — O0990 Supervision of high risk pregnancy, unspecified, unspecified trimester: Secondary | ICD-10-CM

## 2020-09-06 MED ORDER — ONDANSETRON HCL 8 MG PO TABS
8.0000 mg | ORAL_TABLET | Freq: Two times a day (BID) | ORAL | 1 refills | Status: DC
Start: 2020-09-06 — End: 2020-11-07

## 2020-09-06 NOTE — Progress Notes (Signed)
   PRENATAL VISIT NOTE  Subjective:  Solomiya Pascale is a 24 y.o. G2P1001 at [redacted]w[redacted]d being seen today for ongoing prenatal care.  She is currently monitored for the following issues for this high-risk pregnancy and has Other fatigue; Intractable migraine with aura without status migrainosus; Vitamin D deficiency; Supervision of high risk pregnancy, antepartum; GBS bacteriuria; Asthma; Atypical squamous cells of undetermined significance (ASCUS) on Papanicolaou smear of cervix; and Vomiting or nausea of pregnancy on their problem list.  The patient states that she has been vomiting about every other day, usually at night, and it is not dependent on what she eats. She reports being short of breath with longer walks and has mild fatigue. Her migraines have subsided. She has not had any of the vaginal bleeding recently. The patient says that she fell on her side this morning, but recovered quickly and denies any soreness. She denies any ankle edema, cramping, contractions, or leaking. Contractions: Not present. Vag. Bleeding: None.  Movement: Present. Denies leaking of fluid.   The following portions of the patient's history were reviewed and updated as appropriate: allergies, current medications, past family history, past medical history, past social history, past surgical history and problem list.   Objective:   Vitals:   09/06/20 1548  BP: 111/73  Pulse: 96  Weight: 153 lb 8 oz (69.6 kg)    Fetal Status: Fundal Height: 23 cm Movement: Absent   General:  Alert, oriented and cooperative. Patient is in no acute distress.  Skin: Skin is warm and dry. No rash noted.   Cardiovascular: Normal heart rate noted  Respiratory: Normal respiratory effort, no problems with respiration noted  Abdomen: Soft, gravid, appropriate for gestational age.  Pain/Pressure: Present     Pelvic: Cervical exam deferred        Extremities: Normal range of motion.  Edema: None  Mental Status: Normal mood and affect. Normal  behavior. Normal judgment and thought content.   Assessment and Plan:  Pregnancy: G2P1001 at [redacted]w[redacted]d  1. Supervision of high risk pregnancy, antepartum  -Patient scheduled for U/S  2. Vomiting or nausea of pregnancy  -Zofran prescribed to control N/V   Preterm labor symptoms and general obstetric precautions including but not limited to vaginal bleeding, contractions, leaking of fluid and fetal movement were reviewed in detail with the patient. Please refer to After Visit Summary for other counseling recommendations.   Discussed continuing to take prenatal vitamins despite nausea. Addressed the patient's concern of the size of fetus leading to a C-section.   Return in about 3 weeks (around 09/27/2020), or LROB MY Chart with KK.  Future Appointments  Date Time Provider Department Center  09/20/2020  2:15 PM [facility] US2 [facility]  10/03/2020  3:55 PM Kooistra, Charlesetta Garibaldi, CNM [facility]   Attestation of Supervision of Student:  I confirm that I have verified the information documented in the physician assistant student's note and that I have also personally reperformed the history, physical exam and all medical decision making activities.  I have verified that all services and findings are accurately documented in this student's note; and I agree with management and plan as outlined in the documentation. I have also made any necessary editorial changes.   Marylene Land, CNM [facility] [employer], [facility] 09/07/2020 6:46 AM  Zadie Cleverly, [place]

## 2020-09-20 ENCOUNTER — Ambulatory Visit: Payer: BC Managed Care – PPO | Attending: Student

## 2020-09-20 ENCOUNTER — Other Ambulatory Visit: Payer: Self-pay

## 2020-09-20 ENCOUNTER — Other Ambulatory Visit: Payer: Self-pay | Admitting: Student

## 2020-09-20 DIAGNOSIS — O0990 Supervision of high risk pregnancy, unspecified, unspecified trimester: Secondary | ICD-10-CM | POA: Diagnosis present

## 2020-09-20 DIAGNOSIS — R8271 Bacteriuria: Secondary | ICD-10-CM | POA: Diagnosis present

## 2020-09-21 ENCOUNTER — Other Ambulatory Visit: Payer: Self-pay | Admitting: *Deleted

## 2020-09-21 DIAGNOSIS — N1330 Unspecified hydronephrosis: Secondary | ICD-10-CM

## 2020-10-03 ENCOUNTER — Telehealth (INDEPENDENT_AMBULATORY_CARE_PROVIDER_SITE_OTHER): Payer: BC Managed Care – PPO | Admitting: Student

## 2020-10-03 DIAGNOSIS — R8271 Bacteriuria: Secondary | ICD-10-CM

## 2020-10-03 DIAGNOSIS — O0990 Supervision of high risk pregnancy, unspecified, unspecified trimester: Secondary | ICD-10-CM

## 2020-10-03 DIAGNOSIS — Z3A25 25 weeks gestation of pregnancy: Secondary | ICD-10-CM

## 2020-10-03 DIAGNOSIS — O99820 Streptococcus B carrier state complicating pregnancy: Secondary | ICD-10-CM

## 2020-10-03 MED ORDER — BLOOD PRESSURE MONITORING DEVI: 1.0000 | 0 refills | Status: DC

## 2020-10-03 NOTE — Patient Instructions (Signed)
AREA PEDIATRIC/FAMILY PRACTICE PHYSICIANS  [place]outheast [place] ([address]) . [facility] o Chambliss, MD; Eniola, MD; Hale, MD; Hensel, MD; McDiarmid, MD; McIntyer, MD; Neal, MD; Walden, MD o [address] [place], [place] [address] o [phone] o Mon-Fri 8:30-12:30, 1:30-5:00 o Providers come to see babies at [facility] o Accepting Medicaid . [facility] o Limited providers who accept newborns: Koirala, MD; Morrow, MD; Wolters, MD o [address], [place], [place] [address] o [phone] o Mon-Fri 8:00-5:30 o Babies seen by providers at [facility] o Does NOT accept Medicaid o Please call early in hospitalization for appointment (limited availability)  . [facility] o Mulberry, MD o [address] [place], [place] [address] o [phone] o Mon, Tue, Thur, Fri 8:30-5:00, Wed 10:00-7:00 (closed 1-2pm) o Babies seen by [facility] providers o Accepting Medicaid . [facility] - Pediatrician o Rubin, MD o [address], [place], [place] [address] o [phone] o Mon-Fri 8:30-5:00, Sat 8:30-12:00 o Provider comes to see babies at [facility] o Accepting Medicaid o Must have been referred from current patients or contacted office prior to delivery . [facility] ([facility]) o Brown, MD; Chandler, MD; Ettefagh, MD; Grant, MD; Lester, MD; McCormick, MD; McQueen, MD; Prose, MD; Simha, MD; Stanley, MD; Stryffeler, NP; Tebben, NP o [address], [place], [place] [address] o [phone] o Mon, Tue, Thur, Fri 8:30-5:30, Wed 9:30-5:30, Sat 8:30-12:30 o Babies seen by [facility] providers o Accepting Medicaid o Only accepting infants of first-time parents or siblings of current patients o Hospital discharge coordinator will make follow-up appointment . Jack Amos o [address],  [place], [place]  [address] o [phone]   Fax - [phone] . [facility] o [address], [place], [place]  [address] o Phone - [phone]   Fax - [phone] . Shilpa Gosrani o [address], [place], [place]  [address] o [phone]  [place] ([address]) . [facility] o Bates, MD; Brassfield, MD; Cooper, Cox, MD; MD; Davis, MD; Dovico, MD; Ettefaugh, MD; Little, MD; Lowe, MD; Keiffer, MD; Melvin, MD; Sumner, MD; Williams, MD o [address], [place], [place] [address] o [phone] o Mon-Fri 8:30-5:00 (extended evenings Mon-Thur as needed), Sat-Sun 10:00-1:00 o Providers come to see babies at [facility] o Accepting Medicaid for families of first-time babies and families with all children in the household age 3 and under. Must register with office prior to making appointment (M-F only). . [facility] o Henson, NP; Knapp, MD; Lalonde, MD; Tysinger, PA o [address] [place], [place] [address] o [phone] o Mon-Fri 8:00-5:00 o Babies seen by providers at [facility] o Does NOT accept Medicaid/Commercial Insurance Only . [facility] ([facility])  o Artis, MD; Barnes, MD; Bratton, MD; Coccaro, MD; Lockett Gardner, MD; Kramer, MD; Marshall, MD; Netherton, MD; Poleto, MD; Skinner, MD o [address] [place], [place] [address] o [phone] o Mon-Fri 8:30-5:30, Sat (Oct.-Mar.) 9:00-1:00 o Babies seen by providers at [facility] o Accepting Medicaid  [place] ([address]) . [facility] o Reid, MD; Warner, MD o [address], [place], [place] [address] o [phone] o Mon-Fri 8:30-5:00, Sat 8:30-12:00 o Providers come to see babies at [facility] o Does NOT accept Medicaid . [facility] o Becker, PA; Hagler, MD; Scifres, PA; Sun, MD; Swayne, MD o [address],  [place], [place] [address] o [phone] o Mon-Fri 8:00-5:00 o Babies seen by providers at [facility] o Does NOT accept Medicaid o Only accepting babies of parents who are patients o Please call early in hospitalization for appointment (limited availability) . [facility] o Clark, MD; Frye, MD; Kelleher, MD; Mack, NP; Miller, MD; O'Keller, MD; Patterson, NP; Pudlo, MD; Puzio, MD; Thomas, MD; Tucker, MD; Twiselton, MD o [address], [place], [place] [address] o [phone] o Mon-Fri 8:00-5:00, Sat 9:00-12:00 o Providers come to see babies at [facility] o Does NOT accept Medicaid  [place] ([address]) . [facility] o Limited providers accepting new patients: Brake, NP; Wharton, PA o [address], [place], [place] [address] o [phone] o Mon-Fri 8:00-5:00 o Babies seen by providers at [facility] o Does NOT accept Medicaid o Only accepting babies of parents who are patients o Please call early in hospitalization for appointment (limited availability) . [facility] o Gay, MD; Quinlan, MD o [address] [place], [place] [address] o [phone] (press 1 to schedule appointment) o Mon-Fri 8:00-5:00 o Providers come to see babies at [facility] o Does NOT accept Medicaid . [facility] o Mazer, MD o [address] [place], [place] [address] o [phone] o Mon-Fri 8:30-5:00 (lunch 12:30-1:00), extended hours by appointment only Wed 5:00-6:30 o Babies seen by [facility] providers o Accepting Medicaid . [facility] o Banks, MD; Jordan, MD; Koberlein, MD o [address], [place], [place] [address] o [phone] o Mon-Fri 8:00-5:00 o Babies seen by [facility] providers o Does NOT accept Medicaid .  [facility] o Parker, MD; Hunter, MD; Wallace, DO o [address] [place], [place]  [address] o [phone] o Mon-Fri 8:00-5:00 o Babies seen by [facility] providers o Does NOT accept Medicaid . [facility] o Brandon, PA; Brecken, PA; Christy, NP; Dees, MD; DeClaire, MD; DeWeese, MD; Hansen, NP; Mills, NP; Parrish, NP; Smoot, NP; Summer, MD; Vapne, MD o [address] [place], [place] [address] o [phone] o Mon-Fri 8:30-5:00, Sat 10:00-1:00 o Providers come to see babies at [facility] o Does NOT accept Medicaid o Free prenatal information session Tuesdays at 4:45pm . [facility] o Bouska, MD; Gordon, PA; Jeffery, PA; Weber, PA o [address] [place] [address] o [phone] o Mon-Fri 7:30-5:30 o Babies seen by [facility] providers . [facility] o [address], [place], [place]  [address] o [phone]   Fax - [phone]  [place] ([address]) . [facility] o [place], MD o [address] [place], [place] [address] o [phone] o Mon-Thur 8:00-6:00 o Providers come to see babies at [facility] o Accepting Medicaid . [facility] o Anderson, NP; Badger, MD; Beal, PA; Spencer, PA o [address] [place],  [address] o [phone] o Mon-Thur 7:30-7:30, Fri 7:30-4:30 o Babies seen by [facility] providers o Accepting Medicaid . [facility] o Agbuya, MD; Klett, NP; Romgoolam, MD o [address], [place],  [address] o [phone] o Mon-Fri 8:30-5:00, Sat 8:30-12:00 o Providers come to see babies at [facility] o Accepting Medicaid o Must have "Meet & Greet" appointment at office prior to delivery . [facility] ([facility] o McCord,   MD; Wallace, MD; Wood, MD o [address], [place], [place] [address] o [phone] o Mon-Wed 8:00-6:00, Thur-Fri 8:00-5:00, Sat 9:00-12:00 o Providers come to  see babies at [facility] o Does NOT accept Medicaid o Only accepting siblings of current patients . [facility]  o [address], [place], [place]  [address] o [phone]   Fax - [phone] . [facility] o [address], [place], [place]  [address] o [phone]   Fax - [phone]  [place] ([address] & [address]) . [facility] o Cirigliano, DO; Matthews, DO o [address] [place], [place] [address] o [phone] o Mon-Fri 7:00-5:00 o Babies seen by [facility] providers o Does NOT accept Medicaid . [facility] o Briscoe, MD; Howley, PA; Moreira, PA o [address], [place], [place] [address] o [phone] o Mon-Fri 8:00-5:00 o Babies seen by [facility] providers o Accepting Medicaid . [facility] o Boyd, MD; Church, PA; Jones, NP; Osborn, PA o [address], , [place] [address] o [phone] o Mon-Fri 8:00-5:00 o Babies seen by providers at [facility] o Accepting Medicaid  [place] ([address]) . [facility] [place] o Wendling, DO o [address] [place], [place] [address] o [phone] o Mon-Fri 8:00-5:00 o Babies seen by [facility] providers o Does NOT accept Medicaid o Limited availability, please call early in hospitalization to schedule follow-up . [facility] o Calderon, PA; Cummings, MD; Dillard, MD; Martin, PA; Olson, MD; VanDeven, PA o [address], [place], [place] [address] o [phone] o Mon-Fri 8:30-5:00, Sat 9:00-12:00 o Babies seen by providers at [facility] o Accepting Medicaid o Please register online then schedule online or call office o [email or web address] . [facility] ([facility] o Hunter, NP; Kumar, MD; Martin Rogers, PA o [address], [place], [place] [address] o [phone] o Mon-Fri 8:00-5:00 o Babies seen by providers at [facility] o Accepting Medicaid . [facility] ([facility]) o [place], MD; Kristi Fleenor, NP; West, MD o [address], [place], [place] [address] o [phone] o Mon-Fri 8:00-5:30, Sat&Sun by appointment (phones open at 8:30) o Babies seen by [facility] providers o Accepting Medicaid o Must be a first-time baby or sibling of current patient . [facility] [place]  o [address], [place], [place]  [address] o [phone]   Fax - [phone]  [place] ([address]) . [facility] o Brown, PA; Cowen, PA; Rice, MD; Helton, PA; Spry, MD o [address] [place], [place] [address] o [phone] o Mon-Thur 8:00-7:00, Fri 8:00-5:00, Sat 8:00-12:00, Sun 9:00-12:00 o Babies seen by [facility] providers o Accepting Medicaid . [facility] o Coe-Goins, MD; Marshall, MD; Pierre-Louis, MD o [address], [place], [place] [address] o [phone] o Mon-Thur 8:00-5:00 o Babies seen by providers at [facility] o Accepting Medicaid . [facility] o Bratton, MD; Coe-Goins, MD; Hayes, MD; Lewis, MD; List, MD; Lott, MD; Marshall, MD; Moran, MD; O'Neal, MD; Pierre-Louis, MD; Pitonzo, MD; Scholer, MD; Spangle, MD o [address] [place], [place]   [address] o [phone] o Mon-Fri 8:00-5:30, Sat (Oct.-Mar.) 9:00-1:00 o Babies seen by providers at [facility] o Accepting Medicaid o Must fill out new patient packet, available online at [email or web address] . [facility] ([facility]) o Friddle, NP; Harris, NP; Kelly, NP; Logan, MD;  Melvin, PA; Poth, MD; Ramadoss, MD; Stanton, NP o [address], [place], [place] [address] o [phone] o Mon-Thur 8:00-5:30, Fri 8:00-5:00 o Babies seen by providers at [facility] o Accepting Medicaid  [place] ([address]) . [facility] o Dixon, PA; Louisburg, MD; Pickard, MD; Tapia, PA o [address], [place], [place] [address] o [phone] o Mon-Fri 8:00-5:00 o Babies seen by providers at [facility] o Accepting Medicaid   [place] ([address]) . [facility] [place] o Masneri, DO; Meyers, MD; Nelson, PA o [address], [place], [place] [address] o [phone] o Mon-Fri 8:00-5:00 o Babies seen by providers at [facility] o Does NOT accept Medicaid o Limited appointment availability, please call early in hospitalization  . [facility] [place] o Kunedd, DO; McGowen, MD o [address], [place], [place] [address] o [phone] o Mon-Fri 8:00-5:00 o Babies seen by [facility] providers o Does NOT accept Medicaid . [facility] [place] o Cameron, MD; MacDonald, MD; Michaels, PA; Nayak, MD o [address] [place] [address], [place], [place] [address] o [phone] o Mon-Fri 8:00-5:00 o After hours clinic ([address] [place], [place] [address]) [phone] Mon-Fri 5:00-8:00, Sat 12:00-6:00, Sun 10:00-4:00 o Babies seen by [facility] providers o Accepting Medicaid . [facility] [place] o [address], [place], [place]  [address] o [phone]   Fax - [phone]  [place] ([address]) . [facility] [place] [facility] o Andy, MD o [address], [place], [place] [address] o [phone] o Mon-Fri 8:00-5:00 o Babies seen by [facility] providers o Does NOT accept Medicaid . [facility] [place] ([facility] [place]) o Eksir, MD o [address], [place], [place]  [address] o [phone] o Mon-Thur 8:00-7:00, Fri 8:00-5:00, Sat 8:00-12:00 o Babies seen by providers at [facility] o Accepting Medicaid - but does not have vaccinations in office (must be received elsewhere) o Limited availability, please call early in hospitalization  [place] ([address]) . [facility]  o Charlene Flemming, MD o [address], [place] [address] o [phone]  Fax [phone]   

## 2020-10-03 NOTE — Progress Notes (Signed)
Pt states does not have  BP Cuff, Pt has [employer] as secondary ins will order cuff thru [facility].

## 2020-10-03 NOTE — Progress Notes (Signed)
Patient ID: Deanna Patterson, female   DOB: [birth year], 24 y.o.   MRN: [id number] I connected with Deanna Patterson 10/03/20 at  3:55 PM EST by: MyChart video and verified that I am speaking with the correct person using two identifiers.  Patient is located at home and provider is located at [facility].     The purpose of this virtual visit is to provide medical care while limiting exposure to the novel coronavirus. I discussed the limitations, risks, security and privacy concerns of performing an evaluation and management service by MyChart video and the availability of in person appointments. I also discussed with the patient that there may be a patient responsible charge related to this service. By engaging in this virtual visit, you consent to the provision of healthcare.  Additionally, you authorize for your insurance to be billed for the services provided during this visit.  The patient expressed understanding and agreed to proceed.  The following staff members participated in the virtual visit:  Corinda Gubler    PRENATAL VISIT NOTE  Subjective:  Deanna Patterson is a 24 y.o. G2P1001 at [redacted]w[redacted]d  for phone visit for ongoing prenatal care.  She is currently monitored for the following issues for this low-risk pregnancy and has Other fatigue; Intractable migraine with aura without status migrainosus; Vitamin D deficiency; Supervision of high risk pregnancy, antepartum; GBS bacteriuria; Asthma; Atypical squamous cells of undetermined significance (ASCUS) on Papanicolaou smear of cervix; and Vomiting or nausea of pregnancy on their problem list.  Patient reports feeling tired. Some back pain. Marland Kitchen Has not picked up BP cuff yet because it was billed to her insurance, not medicaid.  Contractions: Not present. Vag. Bleeding: None.  Movement: Present. Denies leaking of fluid.   The following portions of the patient's history were reviewed and updated as appropriate: allergies, current medications, past family history, past  medical history, past social history, past surgical history and problem list.   Objective:  There were no vitals filed for this visit. Self-Obtained  Fetal Status:     Movement: Present     Assessment and Plan:  Pregnancy: G2P1001 at [redacted]w[redacted]d 1. Supervision of high risk pregnancy, antepartum -List of pediatricians given - Blood Pressure Monitoring DEVI; 1 each by Does not apply route once a week.  Dispense: 1 each; Refill: 0 -Discussed BC, patient wants to do Depo, it worked for her in the past.  -discussed 2 hour GTT in three weeks.  2. GBS bacteriuria Needs treatment in labor  Preterm labor symptoms and general obstetric precautions including but not limited to vaginal bleeding, contractions, leaking of fluid and fetal movement were reviewed in detail with the patient.  Return in about 3 weeks (around 10/24/2020), or for 2 hour and LROB.  Future Appointments  Date Time Provider Department Center  10/25/2020  2:30 PM [facility] NURSE [facility]  10/25/2020  2:45 PM [facility] US5 [facility]     Time spent on virtual visit: 25 minutes  Marylene Land, CNM

## 2020-10-10 ENCOUNTER — Telehealth: Payer: BC Managed Care – PPO | Admitting: Student

## 2020-10-25 ENCOUNTER — Encounter: Payer: Self-pay | Admitting: Family Medicine

## 2020-10-25 ENCOUNTER — Ambulatory Visit: Payer: BC Managed Care – PPO | Attending: Obstetrics and Gynecology

## 2020-10-25 ENCOUNTER — Other Ambulatory Visit: Payer: BC Managed Care – PPO

## 2020-10-25 ENCOUNTER — Ambulatory Visit (INDEPENDENT_AMBULATORY_CARE_PROVIDER_SITE_OTHER): Payer: BC Managed Care – PPO | Admitting: Nurse Practitioner

## 2020-10-25 ENCOUNTER — Ambulatory Visit: Payer: BC Managed Care – PPO | Admitting: *Deleted

## 2020-10-25 ENCOUNTER — Encounter: Payer: Self-pay | Admitting: *Deleted

## 2020-10-25 ENCOUNTER — Other Ambulatory Visit: Payer: Self-pay

## 2020-10-25 VITALS — BP 120/67 | HR 81 | Wt 158.1 lb

## 2020-10-25 DIAGNOSIS — O0990 Supervision of high risk pregnancy, unspecified, unspecified trimester: Secondary | ICD-10-CM | POA: Insufficient documentation

## 2020-10-25 DIAGNOSIS — O26843 Uterine size-date discrepancy, third trimester: Secondary | ICD-10-CM

## 2020-10-25 DIAGNOSIS — O283 Abnormal ultrasonic finding on antenatal screening of mother: Secondary | ICD-10-CM

## 2020-10-25 DIAGNOSIS — N1330 Unspecified hydronephrosis: Secondary | ICD-10-CM | POA: Insufficient documentation

## 2020-10-25 DIAGNOSIS — O99212 Obesity complicating pregnancy, second trimester: Secondary | ICD-10-CM | POA: Diagnosis not present

## 2020-10-25 DIAGNOSIS — Z23 Encounter for immunization: Secondary | ICD-10-CM

## 2020-10-25 DIAGNOSIS — Z3A28 28 weeks gestation of pregnancy: Secondary | ICD-10-CM | POA: Diagnosis not present

## 2020-10-25 DIAGNOSIS — R8271 Bacteriuria: Secondary | ICD-10-CM | POA: Diagnosis present

## 2020-10-25 DIAGNOSIS — E669 Obesity, unspecified: Secondary | ICD-10-CM | POA: Diagnosis not present

## 2020-10-25 NOTE — Progress Notes (Signed)
    Subjective:  Deanna Patterson is a 24 y.o. G2P1001 at [redacted]w[redacted]d being seen today for ongoing prenatal care.  She is currently monitored for the following issues for this high-risk pregnancy and has Other fatigue; Intractable migraine with aura without status migrainosus; Vitamin D deficiency; Supervision of high risk pregnancy, antepartum; GBS bacteriuria; Asthma; Atypical squamous cells of undetermined significance (ASCUS) on Papanicolaou smear of cervix; and Vomiting or nausea of pregnancy on their problem list.  Patient reports not sleeping well at night.  Contractions: Not present. Vag. Bleeding: None.  Movement: Present. Denies leaking of fluid.   The following portions of the patient's history were reviewed and updated as appropriate: allergies, current medications, past family history, past medical history, past social history, past surgical history and problem list. Problem list updated.  Objective:   Vitals:   10/25/20 0902  BP: 120/67  Pulse: 81  Weight: 158 lb 1.6 oz (71.7 kg)    Fetal Status: Fetal Heart Rate (bpm): 144 Fundal Height: 32 cm Movement: Present     General:  Alert, oriented and cooperative. Patient is in no acute distress.  Skin: Skin is warm and dry. No rash noted.   Cardiovascular: Normal heart rate noted  Respiratory: Normal respiratory effort, no problems with respiration noted  Abdomen: Soft, gravid, appropriate for gestational age. Pain/Pressure: Absent     Pelvic:  Cervical exam deferred        Extremities: Normal range of motion.  Edema: None  Mental Status: Normal mood and affect. Normal behavior. Normal judgment and thought content.   Urinalysis:      Assessment and Plan:  Pregnancy: G2P1001 at [redacted]w[redacted]d  1. Supervision of high risk pregnancy, antepartum Reviewed checking BP weekly and recording in babyscripts Reviewed sleep hygiene with client - routine before bed, no screen time one hour before bed, relaxation techniques Advised contacting  Pediatrician - has list - to make the call 28 week labs done today No problems with headaches now in pregnancy  - Tdap vaccine greater than or equal to 7yo IM  2. Uterine size date discrepancy pregnancy, third trimester Will watch and consider [place] if continues to be elevated  Preterm labor symptoms and general obstetric precautions including but not limited to vaginal bleeding, contractions, leaking of fluid and fetal movement were reviewed in detail with the patient. Please refer to After Visit Summary for other counseling recommendations.  Return in about 2 weeks (around 11/08/2020).  Nolene Bernheim, RN, MSN, NP-BC Nurse Practitioner, [facility] for [employer], [facility] 10/25/2020 9:29 AM

## 2020-10-26 ENCOUNTER — Other Ambulatory Visit: Payer: Self-pay | Admitting: *Deleted

## 2020-10-26 DIAGNOSIS — O35HXX0 Maternal care for other (suspected) fetal abnormality and damage, fetal lower extremities anomalies, not applicable or unspecified: Secondary | ICD-10-CM

## 2020-10-26 LAB — CBC
Hematocrit: 33.3 % — ABNORMAL LOW (ref 34.0–46.6)
Hemoglobin: 11.4 g/dL (ref 11.1–15.9)
MCH: 31 pg (ref 26.6–33.0)
MCHC: 34.2 g/dL (ref 31.5–35.7)
MCV: 91 fL (ref 79–97)
Platelets: 249 10*3/uL (ref 150–450)
RBC: 3.68 x10E6/uL — ABNORMAL LOW (ref 3.77–5.28)
RDW: 12.2 % (ref 11.7–15.4)
WBC: 6.3 10*3/uL (ref 3.4–10.8)

## 2020-10-26 LAB — RPR: RPR Ser Ql: NONREACTIVE

## 2020-10-26 LAB — HIV ANTIBODY (ROUTINE TESTING W REFLEX): HIV Screen 4th Generation wRfx: NONREACTIVE

## 2020-10-26 LAB — GLUCOSE TOLERANCE, 2 HOURS W/ 1HR
Glucose, 1 hour: 148 mg/dL (ref 65–179)
Glucose, 2 hour: 84 mg/dL (ref 65–152)
Glucose, Fasting: 77 mg/dL (ref 65–91)

## 2020-11-07 ENCOUNTER — Other Ambulatory Visit: Payer: Self-pay

## 2020-11-07 ENCOUNTER — Ambulatory Visit (INDEPENDENT_AMBULATORY_CARE_PROVIDER_SITE_OTHER): Payer: BC Managed Care – PPO | Admitting: Family Medicine

## 2020-11-07 VITALS — BP 120/76 | HR 101 | Wt 153.0 lb

## 2020-11-07 DIAGNOSIS — G43119 Migraine with aura, intractable, without status migrainosus: Secondary | ICD-10-CM

## 2020-11-07 DIAGNOSIS — R8271 Bacteriuria: Secondary | ICD-10-CM

## 2020-11-07 DIAGNOSIS — J45909 Unspecified asthma, uncomplicated: Secondary | ICD-10-CM

## 2020-11-07 DIAGNOSIS — O0990 Supervision of high risk pregnancy, unspecified, unspecified trimester: Secondary | ICD-10-CM

## 2020-11-07 NOTE — Patient Instructions (Signed)
 Third Trimester of Pregnancy The third trimester is from week 28 through week 40 (months 7 through 9). The third trimester is a time when the unborn baby (fetus) is growing rapidly. At the end of the ninth month, the fetus is about 20 inches in length and weighs 6-10 pounds. Body changes during your third trimester Your body will continue to go through many changes during pregnancy. The changes vary from woman to woman. During the third trimester:  Your weight will continue to increase. You can expect to gain 25-35 pounds (11-16 kg) by the end of the pregnancy.  You may begin to get stretch marks on your hips, abdomen, and breasts.  You may urinate more often because the fetus is moving lower into your pelvis and pressing on your bladder.  You may develop or continue to have heartburn. This is caused by increased hormones that slow down muscles in the digestive tract.  You may develop or continue to have constipation because increased hormones slow digestion and cause the muscles that push waste through your intestines to relax.  You may develop hemorrhoids. These are swollen veins (varicose veins) in the rectum that can itch or be painful.  You may develop swollen, bulging veins (varicose veins) in your legs.  You may have increased body aches in the pelvis, back, or thighs. This is due to weight gain and increased hormones that are relaxing your joints.  You may have changes in your hair. These can include thickening of your hair, rapid growth, and changes in texture. Some women also have hair loss during or after pregnancy, or hair that feels dry or thin. Your hair will most likely return to normal after your baby is born.  Your breasts will continue to grow and they will continue to become tender. A yellow fluid (colostrum) may leak from your breasts. This is the first milk you are producing for your baby.  Your belly button may stick out.  You may notice more swelling in your  hands, face, or ankles.  You may have increased tingling or numbness in your hands, arms, and legs. The skin on your belly may also feel numb.  You may feel short of breath because of your expanding uterus.  You may have more problems sleeping. This can be caused by the size of your belly, increased need to urinate, and an increase in your body's metabolism.  You may notice the fetus "dropping," or moving lower in your abdomen (lightening).  You may have increased vaginal discharge.  You may notice your joints feel loose and you may have pain around your pelvic bone. What to expect at prenatal visits You will have prenatal exams every 2 weeks until week 36. Then you will have weekly prenatal exams. During a routine prenatal visit:  You will be weighed to make sure you and the baby are growing normally.  Your blood pressure will be taken.  Your abdomen will be measured to track your baby's growth.  The fetal heartbeat will be listened to.  Any test results from the previous visit will be discussed.  You may have a cervical check near your due date to see if your cervix has softened or thinned (effaced).  You will be tested for Group B streptococcus. This happens between 35 and 37 weeks. Your health care provider may ask you:  What your birth plan is.  How you are feeling.  If you are feeling the baby move.  If you have had any   abnormal symptoms, such as leaking fluid, bleeding, severe headaches, or abdominal cramping.  If you are using any tobacco products, including cigarettes, chewing tobacco, and electronic cigarettes.  If you have any questions. Other tests or screenings that may be performed during your third trimester include:  Blood tests that check for low iron levels (anemia).  Fetal testing to check the health, activity level, and growth of the fetus. Testing is done if you have certain medical conditions or if there are problems during the  pregnancy.  Nonstress test (NST). This test checks the health of your baby to make sure there are no signs of problems, such as the baby not getting enough oxygen. During this test, a belt is placed around your belly. The baby is made to move, and its heart rate is monitored during movement. What is false labor? False labor is a condition in which you feel small, irregular tightenings of the muscles in the womb (contractions) that usually go away with rest, changing position, or drinking water. These are called Braxton Hicks contractions. Contractions may last for hours, days, or even weeks before true labor sets in. If contractions come at regular intervals, become more frequent, increase in intensity, or become painful, you should see your health care provider. What are the signs of labor?  Abdominal cramps.  Regular contractions that start at 10 minutes apart and become stronger and more frequent with time.  Contractions that start on the top of the uterus and spread down to the lower abdomen and back.  Increased pelvic pressure and dull back pain.  A watery or bloody mucus discharge that comes from the vagina.  Leaking of amniotic fluid. This is also known as your "water breaking." It could be a slow trickle or a gush. Let your health care provider know if it has a color or strange odor. If you have any of these signs, call your health care provider right away, even if it is before your due date. Follow these instructions at home: Medicines  Follow your health care provider's instructions regarding medicine use. Specific medicines may be either safe or unsafe to take during pregnancy.  Take a prenatal vitamin that contains at least 600 micrograms (mcg) of folic acid.  If you develop constipation, try taking a stool softener if your health care provider approves. Eating and drinking   Eat a balanced diet that includes fresh fruits and vegetables, whole grains, good sources of protein  such as meat, eggs, or tofu, and low-fat dairy. Your health care provider will help you determine the amount of weight gain that is right for you.  Avoid raw meat and uncooked cheese. These carry germs that can cause birth defects in the baby.  If you have low calcium intake from food, talk to your health care provider about whether you should take a daily calcium supplement.  Eat four or five small meals rather than three large meals a day.  Limit foods that are high in fat and processed sugars, such as fried and sweet foods.  To prevent constipation: ? Drink enough fluid to keep your urine clear or pale yellow. ? Eat foods that are high in fiber, such as fresh fruits and vegetables, whole grains, and beans. Activity  Exercise only as directed by your health care provider. Most women can continue their usual exercise routine during pregnancy. Try to exercise for 30 minutes at least 5 days a week. Stop exercising if you experience uterine contractions.  Avoid heavy lifting.    Do not exercise in extreme heat or humidity, or at high altitudes.  Wear low-heel, comfortable shoes.  Practice good posture.  You may continue to have sex unless your health care provider tells you otherwise. Relieving pain and discomfort  Take frequent breaks and rest with your legs elevated if you have leg cramps or low back pain.  Take warm sitz baths to soothe any pain or discomfort caused by hemorrhoids. Use hemorrhoid cream if your health care provider approves.  Wear a good support bra to prevent discomfort from breast tenderness.  If you develop varicose veins: ? Wear support pantyhose or compression stockings as told by your healthcare provider. ? Elevate your feet for 15 minutes, 3-4 times a day. Prenatal care  Write down your questions. Take them to your prenatal visits.  Keep all your prenatal visits as told by your health care provider. This is important. Safety  Wear your seat belt at  all times when driving.  Make a list of emergency phone numbers, including numbers for family, friends, the hospital, and police and fire departments. General instructions  Avoid cat litter boxes and soil used by cats. These carry germs that can cause birth defects in the baby. If you have a cat, ask someone to clean the litter box for you.  Do not travel far distances unless it is absolutely necessary and only with the approval of your health care provider.  Do not use hot tubs, steam rooms, or saunas.  Do not drink alcohol.  Do not use any products that contain nicotine or tobacco, such as cigarettes and e-cigarettes. If you need help quitting, ask your health care provider.  Do not use any medicinal herbs or unprescribed drugs. These chemicals affect the formation and growth of the baby.  Do not douche or use tampons or scented sanitary pads.  Do not cross your legs for long periods of time.  To prepare for the arrival of your baby: ? Take prenatal classes to understand, practice, and ask questions about labor and delivery. ? Make a trial run to the hospital. ? Visit the hospital and tour the maternity area. ? Arrange for maternity or paternity leave through employers. ? Arrange for family and friends to take care of pets while you are in the hospital. ? Purchase a rear-facing car seat and make sure you know how to install it in your car. ? Pack your hospital bag. ? Prepare the baby's nursery. Make sure to remove all pillows and stuffed animals from the baby's crib to prevent suffocation.  Visit your dentist if you have not gone during your pregnancy. Use a soft toothbrush to brush your teeth and be gentle when you floss. Contact a health care provider if:  You are unsure if you are in labor or if your water has broken.  You become dizzy.  You have mild pelvic cramps, pelvic pressure, or nagging pain in your abdominal area.  You have lower back pain.  You have persistent  nausea, vomiting, or diarrhea.  You have an unusual or bad smelling vaginal discharge.  You have pain when you urinate. Get help right away if:  Your water breaks before 37 weeks.  You have regular contractions less than 5 minutes apart before 37 weeks.  You have a fever.  You are leaking fluid from your vagina.  You have spotting or bleeding from your vagina.  You have severe abdominal pain or cramping.  You have rapid weight loss or weight gain.  You   have shortness of breath with chest pain.  You notice sudden or extreme swelling of your face, hands, ankles, feet, or legs.  Your baby makes fewer than 10 movements in 2 hours.  You have severe headaches that do not go away when you take medicine.  You have vision changes. Summary  The third trimester is from week 28 through week 40, months 7 through 9. The third trimester is a time when the unborn baby (fetus) is growing rapidly.  During the third trimester, your discomfort may increase as you and your baby continue to gain weight. You may have abdominal, leg, and back pain, sleeping problems, and an increased need to urinate.  During the third trimester your breasts will keep growing and they will continue to become tender. A yellow fluid (colostrum) may leak from your breasts. This is the first milk you are producing for your baby.  False labor is a condition in which you feel small, irregular tightenings of the muscles in the womb (contractions) that eventually go away. These are called Braxton Hicks contractions. Contractions may last for hours, days, or even weeks before true labor sets in.  Signs of labor can include: abdominal cramps; regular contractions that start at 10 minutes apart and become stronger and more frequent with time; watery or bloody mucus discharge that comes from the vagina; increased pelvic pressure and dull back pain; and leaking of amniotic fluid. This information is not intended to replace advice  given to you by your health care provider. Make sure you discuss any questions you have with your health care provider. Document Revised: 03/03/2019 Document Reviewed: 12/16/2016 Else[employer] Patient Education  2020 [employer]   Contraception Choices Contraception, also called birth control, refers to methods or devices that prevent pregnancy. Hormonal methods Contraceptive implant  A contraceptive implant is a thin, plastic tube that contains a hormone. It is inserted into the upper part of the arm. It can remain in place for up to 3 years. Progestin-only injections Progestin-only injections are injections of progestin, a synthetic form of the hormone progesterone. They are given every 3 months by a health care provider. Birth control pills  Birth control pills are pills that contain hormones that prevent pregnancy. They must be taken once a day, preferably at the same time each day. Birth control patch  The birth control patch contains hormones that prevent pregnancy. It is placed on the skin and must be changed once a week for three weeks and removed on the fourth week. A prescription is needed to use this method of contraception. Vaginal ring  A vaginal ring contains hormones that prevent pregnancy. It is placed in the vagina for three weeks and removed on the fourth week. After that, the process is repeated with a new ring. A prescription is needed to use this method of contraception. Emergency contraceptive Emergency contraceptives prevent pregnancy after unprotected sex. They come in pill form and can be taken up to 5 days after sex. They work best the sooner they are taken after having sex. Most emergency contraceptives are available without a prescription. This method should not be used as your only form of birth control. Barrier methods Female condom  A female condom is a thin sheath that is worn over the penis during sex. Condoms keep sperm from going inside a woman's body. They can  be used with a spermicide to increase their effectiveness. They should be disposed after a single use. Female condom  A female condom is a soft,   loose-fitting sheath that is put into the vagina before sex. The condom keeps sperm from going inside a woman's body. They should be disposed after a single use. Diaphragm  A diaphragm is a soft, dome-shaped barrier. It is inserted into the vagina before sex, along with a spermicide. The diaphragm blocks sperm from entering the uterus, and the spermicide kills sperm. A diaphragm should be left in the vagina for 6-8 hours after sex and removed within 24 hours. A diaphragm is prescribed and fitted by a health care provider. A diaphragm should be replaced every 1-2 years, after giving birth, after gaining more than 15 lb (6.8 kg), and after pelvic surgery. Cervical cap  A cervical cap is a round, soft latex or plastic cup that fits over the cervix. It is inserted into the vagina before sex, along with spermicide. It blocks sperm from entering the uterus. The cap should be left in place for 6-8 hours after sex and removed within 48 hours. A cervical cap must be prescribed and fitted by a health care provider. It should be replaced every 2 years. Sponge  A sponge is a soft, circular piece of polyurethane foam with spermicide on it. The sponge helps block sperm from entering the uterus, and the spermicide kills sperm. To use it, you make it wet and then insert it into the vagina. It should be inserted before sex, left in for at least 6 hours after sex, and removed and thrown away within 30 hours. Spermicides Spermicides are chemicals that kill or block sperm from entering the cervix and uterus. They can come as a cream, jelly, suppository, foam, or tablet. A spermicide should be inserted into the vagina with an applicator at least 10-15 minutes before sex to allow time for it to work. The process must be repeated every time you have sex. Spermicides do not require  a prescription. Intrauterine contraception Intrauterine device (IUD) An IUD is a T-shaped device that is put in a woman's uterus. There are two types:  Hormone IUD.This type contains progestin, a synthetic form of the hormone progesterone. This type can stay in place for 3-5 years.  Copper IUD.This type is wrapped in copper wire. It can stay in place for 10 years.  Permanent methods of contraception Female tubal ligation In this method, a woman's fallopian tubes are sealed, tied, or blocked during surgery to prevent eggs from traveling to the uterus. Hysteroscopic sterilization In this method, a small, flexible insert is placed into each fallopian tube. The inserts cause scar tissue to form in the fallopian tubes and block them, so sperm cannot reach an egg. The procedure takes about 3 months to be effective. Another form of birth control must be used during those 3 months. Female sterilization This is a procedure to tie off the tubes that carry sperm (vasectomy). After the procedure, the man can still ejaculate fluid (semen). Natural planning methods Natural family planning In this method, a couple does not have sex on days when the woman could become pregnant. Calendar method This means keeping track of the length of each menstrual cycle, identifying the days when pregnancy can happen, and not having sex on those days. Ovulation method In this method, a couple avoids sex during ovulation. Symptothermal method This method involves not having sex during ovulation. The woman typically checks for ovulation by watching changes in her temperature and in the consistency of cervical mucus. Post-ovulation method In this method, a couple waits to have sex until after ovulation. Summary    Contraception, also called birth control, means methods or devices that prevent pregnancy.  Hormonal methods of contraception include implants, injections, pills, patches, vaginal rings, and emergency  contraceptives.  Barrier methods of contraception can include female condoms, female condoms, diaphragms, cervical caps, sponges, and spermicides.  There are two types of IUDs (intrauterine devices). An IUD can be put in a woman's uterus to prevent pregnancy for 3-5 years.  Permanent sterilization can be done through a procedure for males, females, or both.  Natural family planning methods involve not having sex on days when the woman could become pregnant. This information is not intended to replace advice given to you by your health care provider. Make sure you discuss any questions you have with your health care provider. Document Revised: 11/12/2017 Document Reviewed: 12/13/2016 Else[employer] Patient Education  2020 [employer]   Breastfeeding  Choosing to breastfeed is one of the best decisions you can make for yourself and your baby. A change in hormones during pregnancy causes your breasts to make breast milk in your milk-producing glands. Hormones prevent breast milk from being released before your baby is born. They also prompt milk flow after birth. Once breastfeeding has begun, thoughts of your baby, as well as his or her sucking or crying, can stimulate the release of milk from your milk-producing glands. Benefits of breastfeeding Research shows that breastfeeding offers many health benefits for infants and mothers. It also offers a cost-free and convenient way to feed your baby. For your baby  Your first milk (colostrum) helps your baby's digestive system to function better.  Special cells in your milk (antibodies) help your baby to fight off infections.  Breastfed babies are less likely to develop asthma, allergies, obesity, or type 2 diabetes. They are also at lower risk for sudden infant death syndrome (SIDS).  Nutrients in breast milk are better able to meet your baby's needs compared to infant formula.  Breast milk improves your baby's brain development. For  you  Breastfeeding helps to create a very special bond between you and your baby.  Breastfeeding is convenient. Breast milk costs nothing and is always available at the correct temperature.  Breastfeeding helps to burn calories. It helps you to lose the weight that you gained during pregnancy.  Breastfeeding makes your uterus return faster to its size before pregnancy. It also slows bleeding (lochia) after you give birth.  Breastfeeding helps to lower your risk of developing type 2 diabetes, osteoporosis, rheumatoid arthritis, cardiovascular disease, and breast, ovarian, uterine, and endometrial cancer later in life. Breastfeeding basics Starting breastfeeding  Find a comfortable place to sit or lie down, with your neck and back well-supported.  Place a pillow or a rolled-up blanket under your baby to bring him or her to the level of your breast (if you are seated). Nursing pillows are specially designed to help support your arms and your baby while you breastfeed.  Make sure that your baby's tummy (abdomen) is facing your abdomen.  Gently massage your breast. With your fingertips, massage from the outer edges of your breast inward toward the nipple. This encourages milk flow. If your milk flows slowly, you may need to continue this action during the feeding.  Support your breast with 4 fingers underneath and your thumb above your nipple (make the letter "C" with your hand). Make sure your fingers are well away from your nipple and your baby's mouth.  Stroke your baby's lips gently with your finger or nipple.  When your baby's mouth is open   wide enough, quickly bring your baby to your breast, placing your entire nipple and as much of the areola as possible into your baby's mouth. The areola is the colored area around your nipple. ? More areola should be visible above your baby's upper lip than below the lower lip. ? Your baby's lips should be opened and extended outward (flanged) to  ensure an adequate, comfortable latch. ? Your baby's tongue should be between his or her lower gum and your breast.  Make sure that your baby's mouth is correctly positioned around your nipple (latched). Your baby's lips should create a seal on your breast and be turned out (everted).  It is common for your baby to suck about 2-3 minutes in order to start the flow of breast milk. Latching Teaching your baby how to latch onto your breast properly is very important. An improper latch can cause nipple pain, decreased milk supply, and poor weight gain in your baby. Also, if your baby is not latched onto your nipple properly, he or she may swallow some air during feeding. This can make your baby fussy. Burping your baby when you switch breasts during the feeding can help to get rid of the air. However, teaching your baby to latch on properly is still the best way to prevent fussiness from swallowing air while breastfeeding. Signs that your baby has successfully latched onto your nipple  Silent tugging or silent sucking, without causing you pain. Infant's lips should be extended outward (flanged).  Swallowing heard between every 3-4 sucks once your milk has started to flow (after your let-down milk reflex occurs).  Muscle movement above and in front of his or her ears while sucking. Signs that your baby has not successfully latched onto your nipple  Sucking sounds or smacking sounds from your baby while breastfeeding.  Nipple pain. If you think your baby has not latched on correctly, slip your finger into the corner of your baby's mouth to break the suction and place it between your baby's gums. Attempt to start breastfeeding again. Signs of successful breastfeeding Signs from your baby  Your baby will gradually decrease the number of sucks or will completely stop sucking.  Your baby will fall asleep.  Your baby's body will relax.  Your baby will retain a small amount of milk in his or her  mouth.  Your baby will let go of your breast by himself or herself. Signs from you  Breasts that have increased in firmness, weight, and size 1-3 hours after feeding.  Breasts that are softer immediately after breastfeeding.  Increased milk volume, as well as a change in milk consistency and color by the fifth day of breastfeeding.  Nipples that are not sore, cracked, or bleeding. Signs that your baby is getting enough milk  Wetting at least 1-2 diapers during the first 24 hours after birth.  Wetting at least 5-6 diapers every 24 hours for the first week after birth. The urine should be clear or pale yellow by the age of 5 days.  Wetting 6-8 diapers every 24 hours as your baby continues to grow and develop.  At least 3 stools in a 24-hour period by the age of 5 days. The stool should be soft and yellow.  At least 3 stools in a 24-hour period by the age of 7 days. The stool should be seedy and yellow.  No loss of weight greater than 10% of birth weight during the first 3 days of life.  Average weight gain   of 4-7 oz (113-198 g) per week after the age of 4 days.  Consistent daily weight gain by the age of 5 days, without weight loss after the age of 2 weeks. After a feeding, your baby may spit up a small amount of milk. This is normal. Breastfeeding frequency and duration Frequent feeding will help you make more milk and can prevent sore nipples and extremely full breasts (breast engorgement). Breastfeed when you feel the need to reduce the fullness of your breasts or when your baby shows signs of hunger. This is called "breastfeeding on demand." Signs that your baby is hungry include:  Increased alertness, activity, or restlessness.  Movement of the head from side to side.  Opening of the mouth when the corner of the mouth or cheek is stroked (rooting).  Increased sucking sounds, smacking lips, cooing, sighing, or squeaking.  Hand-to-mouth movements and sucking on fingers or  hands.  Fussing or crying. Avoid introducing a pacifier to your baby in the first 4-6 weeks after your baby is born. After this time, you may choose to use a pacifier. Research has shown that pacifier use during the first year of a baby's life decreases the risk of sudden infant death syndrome (SIDS). Allow your baby to feed on each breast as long as he or she wants. When your baby unlatches or falls asleep while feeding from the first breast, offer the second breast. Because newborns are often sleepy in the first few weeks of life, you may need to awaken your baby to get him or her to feed. Breastfeeding times will vary from baby to baby. However, the following rules can serve as a guide to help you make sure that your baby is properly fed:  Newborns (babies 4 weeks of age or younger) may breastfeed every 1-3 hours.  Newborns should not go without breastfeeding for longer than 3 hours during the day or 5 hours during the night.  You should breastfeed your baby a minimum of 8 times in a 24-hour period. Breast milk pumping     Pumping and storing breast milk allows you to make sure that your baby is exclusively fed your breast milk, even at times when you are unable to breastfeed. This is especially important if you go back to work while you are still breastfeeding, or if you are not able to be present during feedings. Your lactation consultant can help you find a method of pumping that works best for you and give you guidelines about how long it is safe to store breast milk. Caring for your breasts while you breastfeed Nipples can become dry, cracked, and sore while breastfeeding. The following recommendations can help keep your breasts moisturized and healthy:  Avoid using soap on your nipples.  Wear a supportive bra designed especially for nursing. Avoid wearing underwire-style bras or extremely tight bras (sports bras).  Air-dry your nipples for 3-4 minutes after each feeding.  Use only  cotton bra pads to absorb leaked breast milk. Leaking of breast milk between feedings is normal.  Use lanolin on your nipples after breastfeeding. Lanolin helps to maintain your skin's normal moisture barrier. Pure lanolin is not harmful (not toxic) to your baby. You may also hand express a few drops of breast milk and gently massage that milk into your nipples and allow the milk to air-dry. In the first few weeks after giving birth, some women experience breast engorgement. Engorgement can make your breasts feel heavy, warm, and tender to the touch. Engorgement   peaks within 3-5 days after you give birth. The following recommendations can help to ease engorgement:  Completely empty your breasts while breastfeeding or pumping. You may want to start by applying warm, moist heat (in the shower or with warm, water-soaked hand towels) just before feeding or pumping. This increases circulation and helps the milk flow. If your baby does not completely empty your breasts while breastfeeding, pump any extra milk after he or she is finished.  Apply ice packs to your breasts immediately after breastfeeding or pumping, unless this is too uncomfortable for you. To do this: ? Put ice in a plastic bag. ? Place a towel between your skin and the bag. ? Leave the ice on for 20 minutes, 2-3 times a day.  Make sure that your baby is latched on and positioned properly while breastfeeding. If engorgement persists after 48 hours of following these recommendations, contact your health care provider or a lactation consultant. Overall health care recommendations while breastfeeding  Eat 3 healthy meals and 3 snacks every day. Well-nourished mothers who are breastfeeding need an additional 450-500 calories a day. You can meet this requirement by increasing the amount of a balanced diet that you eat.  Drink enough water to keep your urine pale yellow or clear.  Rest often, relax, and continue to take your prenatal vitamins  to prevent fatigue, stress, and low vitamin and mineral levels in your body (nutrient deficiencies).  Do not use any products that contain nicotine or tobacco, such as cigarettes and e-cigarettes. Your baby may be harmed by chemicals from cigarettes that pass into breast milk and exposure to secondhand smoke. If you need help quitting, ask your health care provider.  Avoid alcohol.  Do not use illegal drugs or marijuana.  Talk with your health care provider before taking any medicines. These include over-the-counter and prescription medicines as well as vitamins and herbal supplements. Some medicines that may be harmful to your baby can pass through breast milk.  It is possible to become pregnant while breastfeeding. If birth control is desired, ask your health care provider about options that will be safe while breastfeeding your baby. Where to find more information: [employer]: www.llli.org Contact a health care provider if:  You feel like you want to stop breastfeeding or have become frustrated with breastfeeding.  Your nipples are cracked or bleeding.  Your breasts are red, tender, or warm.  You have: ? Painful breasts or nipples. ? A swollen area on either breast. ? A fever or chills. ? Nausea or vomiting. ? Drainage other than breast milk from your nipples.  Your breasts do not become full before feedings by the fifth day after you give birth.  You feel sad and depressed.  Your baby is: ? Too sleepy to eat well. ? Having trouble sleeping. ? More than 1 week old and wetting fewer than 6 diapers in a 24-hour period. ? Not gaining weight by 5 days of age.  Your baby has fewer than 3 stools in a 24-hour period.  Your baby's skin or the white parts of his or her eyes become yellow. Get help right away if:  Your baby is overly tired (lethargic) and does not want to wake up and feed.  Your baby develops an unexplained fever. Summary  Breastfeeding  offers many health benefits for infant and mothers.  Try to breastfeed your infant when he or she shows early signs of hunger.  Gently tickle or stroke your baby's lips with   your finger or nipple to allow the baby to open his or her mouth. Bring the baby to your breast. Make sure that much of the areola is in your baby's mouth. Offer one side and burp the baby before you offer the other side.  Talk with your health care provider or lactation consultant if you have questions or you face problems as you breastfeed. This information is not intended to replace advice given to you by your health care provider. Make sure you discuss any questions you have with your health care provider. Document Revised: 02/04/2018 Document Reviewed: 12/12/2016 Elsevier Patient Education  2020 Elsevier Inc.  

## 2020-11-07 NOTE — Progress Notes (Signed)
   Subjective:  Deanna Patterson is a 24 y.o. G2P1001 at [redacted]w[redacted]d being seen today for ongoing prenatal care.  She is currently monitored for the following issues for this low-risk pregnancy and has Other fatigue; Intractable migraine with aura without status migrainosus; Vitamin D deficiency; Supervision of high risk pregnancy, antepartum; GBS bacteriuria; Asthma; Atypical squamous cells of undetermined significance (ASCUS) on Papanicolaou smear of cervix; and Vomiting or nausea of pregnancy on their problem list.  Patient reports no complaints.  Contractions: Not present. Vag. Bleeding: None.  Movement: Present. Denies leaking of fluid.   The following portions of the patient's history were reviewed and updated as appropriate: allergies, current medications, past family history, past medical history, past social history, past surgical history and problem list. Problem list updated.  Objective:   Vitals:   11/07/20 1454  BP: 120/76  Pulse: (!) 101  Weight: 153 lb (69.4 kg)    Fetal Status: Fetal Heart Rate (bpm): 137 Fundal Height: 31 cm Movement: Present     General:  Alert, oriented and cooperative. Patient is in no acute distress.  Skin: Skin is warm and dry. No rash noted.   Cardiovascular: Normal heart rate noted  Respiratory: Normal respiratory effort, no problems with respiration noted  Abdomen: Soft, gravid, appropriate for gestational age. Pain/Pressure: Present     Pelvic: Vag. Bleeding: None     Cervical exam deferred        Extremities: Normal range of motion.  Edema: None  Mental Status: Normal mood and affect. Normal behavior. Normal judgment and thought content.   Urinalysis:      Assessment and Plan:  Pregnancy: G2P1001 at [redacted]w[redacted]d  1. Supervision of high risk pregnancy, antepartum BP and FHR normal S>D noted at last visit, interestingly patient EFW 16% on [place] from 12/2, has f/u scheduled for next month Measuring 31 cm today, S=D  2. GBS bacteriuria Prophylaxis in  labor  3. Intractable migraine with aura without status migrainosus   4. Uncomplicated asthma, unspecified asthma severity, unspecified whether persistent   Preterm labor symptoms and general obstetric precautions including but not limited to vaginal bleeding, contractions, leaking of fluid and fetal movement were reviewed in detail with the patient. Please refer to After Visit Summary for other counseling recommendations.  Return in 2 weeks (on 11/21/2020) for [facility], ob visit.   Venora Maples, MD

## 2020-11-21 ENCOUNTER — Ambulatory Visit (INDEPENDENT_AMBULATORY_CARE_PROVIDER_SITE_OTHER): Payer: BC Managed Care – PPO | Admitting: Family Medicine

## 2020-11-21 ENCOUNTER — Other Ambulatory Visit: Payer: Self-pay

## 2020-11-21 ENCOUNTER — Encounter: Payer: Self-pay | Admitting: Family Medicine

## 2020-11-21 VITALS — BP 114/74 | HR 87 | Wt 161.0 lb

## 2020-11-21 DIAGNOSIS — R87610 Atypical squamous cells of undetermined significance on cytologic smear of cervix (ASC-US): Secondary | ICD-10-CM

## 2020-11-21 DIAGNOSIS — O0990 Supervision of high risk pregnancy, unspecified, unspecified trimester: Secondary | ICD-10-CM

## 2020-11-21 DIAGNOSIS — R8271 Bacteriuria: Secondary | ICD-10-CM

## 2020-11-21 NOTE — Patient Instructions (Signed)
 Third Trimester of Pregnancy The third trimester is from week 28 through week 40 (months 7 through 9). The third trimester is a time when the unborn baby (fetus) is growing rapidly. At the end of the ninth month, the fetus is about 20 inches in length and weighs 6-10 pounds. Body changes during your third trimester Your body will continue to go through many changes during pregnancy. The changes vary from woman to woman. During the third trimester:  Your weight will continue to increase. You can expect to gain 25-35 pounds (11-16 kg) by the end of the pregnancy.  You may begin to get stretch marks on your hips, abdomen, and breasts.  You may urinate more often because the fetus is moving lower into your pelvis and pressing on your bladder.  You may develop or continue to have heartburn. This is caused by increased hormones that slow down muscles in the digestive tract.  You may develop or continue to have constipation because increased hormones slow digestion and cause the muscles that push waste through your intestines to relax.  You may develop hemorrhoids. These are swollen veins (varicose veins) in the rectum that can itch or be painful.  You may develop swollen, bulging veins (varicose veins) in your legs.  You may have increased body aches in the pelvis, back, or thighs. This is due to weight gain and increased hormones that are relaxing your joints.  You may have changes in your hair. These can include thickening of your hair, rapid growth, and changes in texture. Some women also have hair loss during or after pregnancy, or hair that feels dry or thin. Your hair will most likely return to normal after your baby is born.  Your breasts will continue to grow and they will continue to become tender. A yellow fluid (colostrum) may leak from your breasts. This is the first milk you are producing for your baby.  Your belly button may stick out.  You may notice more swelling in your  hands, face, or ankles.  You may have increased tingling or numbness in your hands, arms, and legs. The skin on your belly may also feel numb.  You may feel short of breath because of your expanding uterus.  You may have more problems sleeping. This can be caused by the size of your belly, increased need to urinate, and an increase in your body's metabolism.  You may notice the fetus "dropping," or moving lower in your abdomen (lightening).  You may have increased vaginal discharge.  You may notice your joints feel loose and you may have pain around your pelvic bone. What to expect at prenatal visits You will have prenatal exams every 2 weeks until week 36. Then you will have weekly prenatal exams. During a routine prenatal visit:  You will be weighed to make sure you and the baby are growing normally.  Your blood pressure will be taken.  Your abdomen will be measured to track your baby's growth.  The fetal heartbeat will be listened to.  Any test results from the previous visit will be discussed.  You may have a cervical check near your due date to see if your cervix has softened or thinned (effaced).  You will be tested for Group B streptococcus. This happens between 35 and 37 weeks. Your health care provider may ask you:  What your birth plan is.  How you are feeling.  If you are feeling the baby move.  If you have had any   abnormal symptoms, such as leaking fluid, bleeding, severe headaches, or abdominal cramping.  If you are using any tobacco products, including cigarettes, chewing tobacco, and electronic cigarettes.  If you have any questions. Other tests or screenings that may be performed during your third trimester include:  Blood tests that check for low iron levels (anemia).  Fetal testing to check the health, activity level, and growth of the fetus. Testing is done if you have certain medical conditions or if there are problems during the  pregnancy.  Nonstress test (NST). This test checks the health of your baby to make sure there are no signs of problems, such as the baby not getting enough oxygen. During this test, a belt is placed around your belly. The baby is made to move, and its heart rate is monitored during movement. What is false labor? False labor is a condition in which you feel small, irregular tightenings of the muscles in the womb (contractions) that usually go away with rest, changing position, or drinking water. These are called Braxton Hicks contractions. Contractions may last for hours, days, or even weeks before true labor sets in. If contractions come at regular intervals, become more frequent, increase in intensity, or become painful, you should see your health care provider. What are the signs of labor?  Abdominal cramps.  Regular contractions that start at 10 minutes apart and become stronger and more frequent with time.  Contractions that start on the top of the uterus and spread down to the lower abdomen and back.  Increased pelvic pressure and dull back pain.  A watery or bloody mucus discharge that comes from the vagina.  Leaking of amniotic fluid. This is also known as your "water breaking." It could be a slow trickle or a gush. Let your health care provider know if it has a color or strange odor. If you have any of these signs, call your health care provider right away, even if it is before your due date. Follow these instructions at home: Medicines  Follow your health care provider's instructions regarding medicine use. Specific medicines may be either safe or unsafe to take during pregnancy.  Take a prenatal vitamin that contains at least 600 micrograms (mcg) of folic acid.  If you develop constipation, try taking a stool softener if your health care provider approves. Eating and drinking   Eat a balanced diet that includes fresh fruits and vegetables, whole grains, good sources of protein  such as meat, eggs, or tofu, and low-fat dairy. Your health care provider will help you determine the amount of weight gain that is right for you.  Avoid raw meat and uncooked cheese. These carry germs that can cause birth defects in the baby.  If you have low calcium intake from food, talk to your health care provider about whether you should take a daily calcium supplement.  Eat four or five small meals rather than three large meals a day.  Limit foods that are high in fat and processed sugars, such as fried and sweet foods.  To prevent constipation: ? Drink enough fluid to keep your urine clear or pale yellow. ? Eat foods that are high in fiber, such as fresh fruits and vegetables, whole grains, and beans. Activity  Exercise only as directed by your health care provider. Most women can continue their usual exercise routine during pregnancy. Try to exercise for 30 minutes at least 5 days a week. Stop exercising if you experience uterine contractions.  Avoid heavy lifting.    Do not exercise in extreme heat or humidity, or at high altitudes.  Wear low-heel, comfortable shoes.  Practice good posture.  You may continue to have sex unless your health care provider tells you otherwise. Relieving pain and discomfort  Take frequent breaks and rest with your legs elevated if you have leg cramps or low back pain.  Take warm sitz baths to soothe any pain or discomfort caused by hemorrhoids. Use hemorrhoid cream if your health care provider approves.  Wear a good support bra to prevent discomfort from breast tenderness.  If you develop varicose veins: ? Wear support pantyhose or compression stockings as told by your healthcare provider. ? Elevate your feet for 15 minutes, 3-4 times a day. Prenatal care  Write down your questions. Take them to your prenatal visits.  Keep all your prenatal visits as told by your health care provider. This is important. Safety  Wear your seat belt at  all times when driving.  Make a list of emergency phone numbers, including numbers for family, friends, the hospital, and police and fire departments. General instructions  Avoid cat litter boxes and soil used by cats. These carry germs that can cause birth defects in the baby. If you have a cat, ask someone to clean the litter box for you.  Do not travel far distances unless it is absolutely necessary and only with the approval of your health care provider.  Do not use hot tubs, steam rooms, or saunas.  Do not drink alcohol.  Do not use any products that contain nicotine or tobacco, such as cigarettes and e-cigarettes. If you need help quitting, ask your health care provider.  Do not use any medicinal herbs or unprescribed drugs. These chemicals affect the formation and growth of the baby.  Do not douche or use tampons or scented sanitary pads.  Do not cross your legs for long periods of time.  To prepare for the arrival of your baby: ? Take prenatal classes to understand, practice, and ask questions about labor and delivery. ? Make a trial run to the hospital. ? Visit the hospital and tour the maternity area. ? Arrange for maternity or paternity leave through employers. ? Arrange for family and friends to take care of pets while you are in the hospital. ? Purchase a rear-facing car seat and make sure you know how to install it in your car. ? Pack your hospital bag. ? Prepare the baby's nursery. Make sure to remove all pillows and stuffed animals from the baby's crib to prevent suffocation.  Visit your dentist if you have not gone during your pregnancy. Use a soft toothbrush to brush your teeth and be gentle when you floss. Contact a health care provider if:  You are unsure if you are in labor or if your water has broken.  You become dizzy.  You have mild pelvic cramps, pelvic pressure, or nagging pain in your abdominal area.  You have lower back pain.  You have persistent  nausea, vomiting, or diarrhea.  You have an unusual or bad smelling vaginal discharge.  You have pain when you urinate. Get help right away if:  Your water breaks before 37 weeks.  You have regular contractions less than 5 minutes apart before 37 weeks.  You have a fever.  You are leaking fluid from your vagina.  You have spotting or bleeding from your vagina.  You have severe abdominal pain or cramping.  You have rapid weight loss or weight gain.  You   have shortness of breath with chest pain.  You notice sudden or extreme swelling of your face, hands, ankles, feet, or legs.  Your baby makes fewer than 10 movements in 2 hours.  You have severe headaches that do not go away when you take medicine.  You have vision changes. Summary  The third trimester is from week 28 through week 40, months 7 through 9. The third trimester is a time when the unborn baby (fetus) is growing rapidly.  During the third trimester, your discomfort may increase as you and your baby continue to gain weight. You may have abdominal, leg, and back pain, sleeping problems, and an increased need to urinate.  During the third trimester your breasts will keep growing and they will continue to become tender. A yellow fluid (colostrum) may leak from your breasts. This is the first milk you are producing for your baby.  False labor is a condition in which you feel small, irregular tightenings of the muscles in the womb (contractions) that eventually go away. These are called Braxton Hicks contractions. Contractions may last for hours, days, or even weeks before true labor sets in.  Signs of labor can include: abdominal cramps; regular contractions that start at 10 minutes apart and become stronger and more frequent with time; watery or bloody mucus discharge that comes from the vagina; increased pelvic pressure and dull back pain; and leaking of amniotic fluid. This information is not intended to replace advice  given to you by your health care provider. Make sure you discuss any questions you have with your health care provider. Document Revised: 03/03/2019 Document Reviewed: 12/16/2016 Else[employer] Patient Education  2020 [employer]   Contraception Choices Contraception, also called birth control, refers to methods or devices that prevent pregnancy. Hormonal methods Contraceptive implant  A contraceptive implant is a thin, plastic tube that contains a hormone. It is inserted into the upper part of the arm. It can remain in place for up to 3 years. Progestin-only injections Progestin-only injections are injections of progestin, a synthetic form of the hormone progesterone. They are given every 3 months by a health care provider. Birth control pills  Birth control pills are pills that contain hormones that prevent pregnancy. They must be taken once a day, preferably at the same time each day. Birth control patch  The birth control patch contains hormones that prevent pregnancy. It is placed on the skin and must be changed once a week for three weeks and removed on the fourth week. A prescription is needed to use this method of contraception. Vaginal ring  A vaginal ring contains hormones that prevent pregnancy. It is placed in the vagina for three weeks and removed on the fourth week. After that, the process is repeated with a new ring. A prescription is needed to use this method of contraception. Emergency contraceptive Emergency contraceptives prevent pregnancy after unprotected sex. They come in pill form and can be taken up to 5 days after sex. They work best the sooner they are taken after having sex. Most emergency contraceptives are available without a prescription. This method should not be used as your only form of birth control. Barrier methods Female condom  A female condom is a thin sheath that is worn over the penis during sex. Condoms keep sperm from going inside a woman's body. They can  be used with a spermicide to increase their effectiveness. They should be disposed after a single use. Female condom  A female condom is a soft,   loose-fitting sheath that is put into the vagina before sex. The condom keeps sperm from going inside a woman's body. They should be disposed after a single use. Diaphragm  A diaphragm is a soft, dome-shaped barrier. It is inserted into the vagina before sex, along with a spermicide. The diaphragm blocks sperm from entering the uterus, and the spermicide kills sperm. A diaphragm should be left in the vagina for 6-8 hours after sex and removed within 24 hours. A diaphragm is prescribed and fitted by a health care provider. A diaphragm should be replaced every 1-2 years, after giving birth, after gaining more than 15 lb (6.8 kg), and after pelvic surgery. Cervical cap  A cervical cap is a round, soft latex or plastic cup that fits over the cervix. It is inserted into the vagina before sex, along with spermicide. It blocks sperm from entering the uterus. The cap should be left in place for 6-8 hours after sex and removed within 48 hours. A cervical cap must be prescribed and fitted by a health care provider. It should be replaced every 2 years. Sponge  A sponge is a soft, circular piece of polyurethane foam with spermicide on it. The sponge helps block sperm from entering the uterus, and the spermicide kills sperm. To use it, you make it wet and then insert it into the vagina. It should be inserted before sex, left in for at least 6 hours after sex, and removed and thrown away within 30 hours. Spermicides Spermicides are chemicals that kill or block sperm from entering the cervix and uterus. They can come as a cream, jelly, suppository, foam, or tablet. A spermicide should be inserted into the vagina with an applicator at least 10-15 minutes before sex to allow time for it to work. The process must be repeated every time you have sex. Spermicides do not require  a prescription. Intrauterine contraception Intrauterine device (IUD) An IUD is a T-shaped device that is put in a woman's uterus. There are two types:  Hormone IUD.This type contains progestin, a synthetic form of the hormone progesterone. This type can stay in place for 3-5 years.  Copper IUD.This type is wrapped in copper wire. It can stay in place for 10 years.  Permanent methods of contraception Female tubal ligation In this method, a woman's fallopian tubes are sealed, tied, or blocked during surgery to prevent eggs from traveling to the uterus. Hysteroscopic sterilization In this method, a small, flexible insert is placed into each fallopian tube. The inserts cause scar tissue to form in the fallopian tubes and block them, so sperm cannot reach an egg. The procedure takes about 3 months to be effective. Another form of birth control must be used during those 3 months. Female sterilization This is a procedure to tie off the tubes that carry sperm (vasectomy). After the procedure, the man can still ejaculate fluid (semen). Natural planning methods Natural family planning In this method, a couple does not have sex on days when the woman could become pregnant. Calendar method This means keeping track of the length of each menstrual cycle, identifying the days when pregnancy can happen, and not having sex on those days. Ovulation method In this method, a couple avoids sex during ovulation. Symptothermal method This method involves not having sex during ovulation. The woman typically checks for ovulation by watching changes in her temperature and in the consistency of cervical mucus. Post-ovulation method In this method, a couple waits to have sex until after ovulation. Summary    Contraception, also called birth control, means methods or devices that prevent pregnancy.  Hormonal methods of contraception include implants, injections, pills, patches, vaginal rings, and emergency  contraceptives.  Barrier methods of contraception can include female condoms, female condoms, diaphragms, cervical caps, sponges, and spermicides.  There are two types of IUDs (intrauterine devices). An IUD can be put in a woman's uterus to prevent pregnancy for 3-5 years.  Permanent sterilization can be done through a procedure for males, females, or both.  Natural family planning methods involve not having sex on days when the woman could become pregnant. This information is not intended to replace advice given to you by your health care provider. Make sure you discuss any questions you have with your health care provider. Document Revised: 11/12/2017 Document Reviewed: 12/13/2016 Else[employer] Patient Education  2020 [employer]   Breastfeeding  Choosing to breastfeed is one of the best decisions you can make for yourself and your baby. A change in hormones during pregnancy causes your breasts to make breast milk in your milk-producing glands. Hormones prevent breast milk from being released before your baby is born. They also prompt milk flow after birth. Once breastfeeding has begun, thoughts of your baby, as well as his or her sucking or crying, can stimulate the release of milk from your milk-producing glands. Benefits of breastfeeding Research shows that breastfeeding offers many health benefits for infants and mothers. It also offers a cost-free and convenient way to feed your baby. For your baby  Your first milk (colostrum) helps your baby's digestive system to function better.  Special cells in your milk (antibodies) help your baby to fight off infections.  Breastfed babies are less likely to develop asthma, allergies, obesity, or type 2 diabetes. They are also at lower risk for sudden infant death syndrome (SIDS).  Nutrients in breast milk are better able to meet your baby's needs compared to infant formula.  Breast milk improves your baby's brain development. For  you  Breastfeeding helps to create a very special bond between you and your baby.  Breastfeeding is convenient. Breast milk costs nothing and is always available at the correct temperature.  Breastfeeding helps to burn calories. It helps you to lose the weight that you gained during pregnancy.  Breastfeeding makes your uterus return faster to its size before pregnancy. It also slows bleeding (lochia) after you give birth.  Breastfeeding helps to lower your risk of developing type 2 diabetes, osteoporosis, rheumatoid arthritis, cardiovascular disease, and breast, ovarian, uterine, and endometrial cancer later in life. Breastfeeding basics Starting breastfeeding  Find a comfortable place to sit or lie down, with your neck and back well-supported.  Place a pillow or a rolled-up blanket under your baby to bring him or her to the level of your breast (if you are seated). Nursing pillows are specially designed to help support your arms and your baby while you breastfeed.  Make sure that your baby's tummy (abdomen) is facing your abdomen.  Gently massage your breast. With your fingertips, massage from the outer edges of your breast inward toward the nipple. This encourages milk flow. If your milk flows slowly, you may need to continue this action during the feeding.  Support your breast with 4 fingers underneath and your thumb above your nipple (make the letter "C" with your hand). Make sure your fingers are well away from your nipple and your baby's mouth.  Stroke your baby's lips gently with your finger or nipple.  When your baby's mouth is open   wide enough, quickly bring your baby to your breast, placing your entire nipple and as much of the areola as possible into your baby's mouth. The areola is the colored area around your nipple. ? More areola should be visible above your baby's upper lip than below the lower lip. ? Your baby's lips should be opened and extended outward (flanged) to  ensure an adequate, comfortable latch. ? Your baby's tongue should be between his or her lower gum and your breast.  Make sure that your baby's mouth is correctly positioned around your nipple (latched). Your baby's lips should create a seal on your breast and be turned out (everted).  It is common for your baby to suck about 2-3 minutes in order to start the flow of breast milk. Latching Teaching your baby how to latch onto your breast properly is very important. An improper latch can cause nipple pain, decreased milk supply, and poor weight gain in your baby. Also, if your baby is not latched onto your nipple properly, he or she may swallow some air during feeding. This can make your baby fussy. Burping your baby when you switch breasts during the feeding can help to get rid of the air. However, teaching your baby to latch on properly is still the best way to prevent fussiness from swallowing air while breastfeeding. Signs that your baby has successfully latched onto your nipple  Silent tugging or silent sucking, without causing you pain. Infant's lips should be extended outward (flanged).  Swallowing heard between every 3-4 sucks once your milk has started to flow (after your let-down milk reflex occurs).  Muscle movement above and in front of his or her ears while sucking. Signs that your baby has not successfully latched onto your nipple  Sucking sounds or smacking sounds from your baby while breastfeeding.  Nipple pain. If you think your baby has not latched on correctly, slip your finger into the corner of your baby's mouth to break the suction and place it between your baby's gums. Attempt to start breastfeeding again. Signs of successful breastfeeding Signs from your baby  Your baby will gradually decrease the number of sucks or will completely stop sucking.  Your baby will fall asleep.  Your baby's body will relax.  Your baby will retain a small amount of milk in his or her  mouth.  Your baby will let go of your breast by himself or herself. Signs from you  Breasts that have increased in firmness, weight, and size 1-3 hours after feeding.  Breasts that are softer immediately after breastfeeding.  Increased milk volume, as well as a change in milk consistency and color by the fifth day of breastfeeding.  Nipples that are not sore, cracked, or bleeding. Signs that your baby is getting enough milk  Wetting at least 1-2 diapers during the first 24 hours after birth.  Wetting at least 5-6 diapers every 24 hours for the first week after birth. The urine should be clear or pale yellow by the age of 5 days.  Wetting 6-8 diapers every 24 hours as your baby continues to grow and develop.  At least 3 stools in a 24-hour period by the age of 5 days. The stool should be soft and yellow.  At least 3 stools in a 24-hour period by the age of 7 days. The stool should be seedy and yellow.  No loss of weight greater than 10% of birth weight during the first 3 days of life.  Average weight gain   of 4-7 oz (113-198 g) per week after the age of 4 days.  Consistent daily weight gain by the age of 5 days, without weight loss after the age of 2 weeks. After a feeding, your baby may spit up a small amount of milk. This is normal. Breastfeeding frequency and duration Frequent feeding will help you make more milk and can prevent sore nipples and extremely full breasts (breast engorgement). Breastfeed when you feel the need to reduce the fullness of your breasts or when your baby shows signs of hunger. This is called "breastfeeding on demand." Signs that your baby is hungry include:  Increased alertness, activity, or restlessness.  Movement of the head from side to side.  Opening of the mouth when the corner of the mouth or cheek is stroked (rooting).  Increased sucking sounds, smacking lips, cooing, sighing, or squeaking.  Hand-to-mouth movements and sucking on fingers or  hands.  Fussing or crying. Avoid introducing a pacifier to your baby in the first 4-6 weeks after your baby is born. After this time, you may choose to use a pacifier. Research has shown that pacifier use during the first year of a baby's life decreases the risk of sudden infant death syndrome (SIDS). Allow your baby to feed on each breast as long as he or she wants. When your baby unlatches or falls asleep while feeding from the first breast, offer the second breast. Because newborns are often sleepy in the first few weeks of life, you may need to awaken your baby to get him or her to feed. Breastfeeding times will vary from baby to baby. However, the following rules can serve as a guide to help you make sure that your baby is properly fed:  Newborns (babies 4 weeks of age or younger) may breastfeed every 1-3 hours.  Newborns should not go without breastfeeding for longer than 3 hours during the day or 5 hours during the night.  You should breastfeed your baby a minimum of 8 times in a 24-hour period. Breast milk pumping     Pumping and storing breast milk allows you to make sure that your baby is exclusively fed your breast milk, even at times when you are unable to breastfeed. This is especially important if you go back to work while you are still breastfeeding, or if you are not able to be present during feedings. Your lactation consultant can help you find a method of pumping that works best for you and give you guidelines about how long it is safe to store breast milk. Caring for your breasts while you breastfeed Nipples can become dry, cracked, and sore while breastfeeding. The following recommendations can help keep your breasts moisturized and healthy:  Avoid using soap on your nipples.  Wear a supportive bra designed especially for nursing. Avoid wearing underwire-style bras or extremely tight bras (sports bras).  Air-dry your nipples for 3-4 minutes after each feeding.  Use only  cotton bra pads to absorb leaked breast milk. Leaking of breast milk between feedings is normal.  Use lanolin on your nipples after breastfeeding. Lanolin helps to maintain your skin's normal moisture barrier. Pure lanolin is not harmful (not toxic) to your baby. You may also hand express a few drops of breast milk and gently massage that milk into your nipples and allow the milk to air-dry. In the first few weeks after giving birth, some women experience breast engorgement. Engorgement can make your breasts feel heavy, warm, and tender to the touch. Engorgement   peaks within 3-5 days after you give birth. The following recommendations can help to ease engorgement:  Completely empty your breasts while breastfeeding or pumping. You may want to start by applying warm, moist heat (in the shower or with warm, water-soaked hand towels) just before feeding or pumping. This increases circulation and helps the milk flow. If your baby does not completely empty your breasts while breastfeeding, pump any extra milk after he or she is finished.  Apply ice packs to your breasts immediately after breastfeeding or pumping, unless this is too uncomfortable for you. To do this: ? Put ice in a plastic bag. ? Place a towel between your skin and the bag. ? Leave the ice on for 20 minutes, 2-3 times a day.  Make sure that your baby is latched on and positioned properly while breastfeeding. If engorgement persists after 48 hours of following these recommendations, contact your health care provider or a lactation consultant. Overall health care recommendations while breastfeeding  Eat 3 healthy meals and 3 snacks every day. Well-nourished mothers who are breastfeeding need an additional 450-500 calories a day. You can meet this requirement by increasing the amount of a balanced diet that you eat.  Drink enough water to keep your urine pale yellow or clear.  Rest often, relax, and continue to take your prenatal vitamins  to prevent fatigue, stress, and low vitamin and mineral levels in your body (nutrient deficiencies).  Do not use any products that contain nicotine or tobacco, such as cigarettes and e-cigarettes. Your baby may be harmed by chemicals from cigarettes that pass into breast milk and exposure to secondhand smoke. If you need help quitting, ask your health care provider.  Avoid alcohol.  Do not use illegal drugs or marijuana.  Talk with your health care provider before taking any medicines. These include over-the-counter and prescription medicines as well as vitamins and herbal supplements. Some medicines that may be harmful to your baby can pass through breast milk.  It is possible to become pregnant while breastfeeding. If birth control is desired, ask your health care provider about options that will be safe while breastfeeding your baby. Where to find more information: [employer]: www.llli.org Contact a health care provider if:  You feel like you want to stop breastfeeding or have become frustrated with breastfeeding.  Your nipples are cracked or bleeding.  Your breasts are red, tender, or warm.  You have: ? Painful breasts or nipples. ? A swollen area on either breast. ? A fever or chills. ? Nausea or vomiting. ? Drainage other than breast milk from your nipples.  Your breasts do not become full before feedings by the fifth day after you give birth.  You feel sad and depressed.  Your baby is: ? Too sleepy to eat well. ? Having trouble sleeping. ? More than 1 week old and wetting fewer than 6 diapers in a 24-hour period. ? Not gaining weight by 5 days of age.  Your baby has fewer than 3 stools in a 24-hour period.  Your baby's skin or the white parts of his or her eyes become yellow. Get help right away if:  Your baby is overly tired (lethargic) and does not want to wake up and feed.  Your baby develops an unexplained fever. Summary  Breastfeeding  offers many health benefits for infant and mothers.  Try to breastfeed your infant when he or she shows early signs of hunger.  Gently tickle or stroke your baby's lips with   your finger or nipple to allow the baby to open his or her mouth. Bring the baby to your breast. Make sure that much of the areola is in your baby's mouth. Offer one side and burp the baby before you offer the other side.  Talk with your health care provider or lactation consultant if you have questions or you face problems as you breastfeed. This information is not intended to replace advice given to you by your health care provider. Make sure you discuss any questions you have with your health care provider. Document Revised: 02/04/2018 Document Reviewed: 12/12/2016 Elsevier Patient Education  2020 Elsevier Inc.  

## 2020-11-21 NOTE — Progress Notes (Signed)
° °  Subjective:  Deanna Patterson is a 24 y.o. G2P1001 at [redacted]w[redacted]d being seen today for ongoing prenatal care.  She is currently monitored for the following issues for this low-risk pregnancy and has Other fatigue; Intractable migraine with aura without status migrainosus; Vitamin D deficiency; Supervision of high risk pregnancy, antepartum; GBS bacteriuria; Asthma; Atypical squamous cells of undetermined significance (ASCUS) on Papanicolaou smear of cervix; and Vomiting or nausea of pregnancy on their problem list.  Patient reports fatigue.  Contractions: Not present. Vag. Bleeding: None.  Movement: Present. Denies leaking of fluid.   The following portions of the patient's history were reviewed and updated as appropriate: allergies, current medications, past family history, past medical history, past social history, past surgical history and problem list. Problem list updated.  Objective:   Vitals:   11/21/20 1539  BP: 114/74  Pulse: 87  Weight: 161 lb (73 kg)    Fetal Status: Fetal Heart Rate (bpm): 141   Movement: Present     General:  Alert, oriented and cooperative. Patient is in no acute distress.  Skin: Skin is warm and dry. No rash noted.   Cardiovascular: Normal heart rate noted  Respiratory: Normal respiratory effort, no problems with respiration noted  Abdomen: Soft, gravid, appropriate for gestational age. Pain/Pressure: Present     Pelvic: Vag. Bleeding: None     Cervical exam deferred        Extremities: Normal range of motion.  Edema: None  Mental Status: Normal mood and affect. Normal behavior. Normal judgment and thought content.   Urinalysis:      Assessment and Plan:  Pregnancy: G2P1001 at [redacted]w[redacted]d  1. Supervision of high risk pregnancy, antepartum BP and FHR normal  2. GBS bacteriuria PPx in labor  3. Atypical squamous cells of undetermined significance (ASCUS) on Papanicolaou smear of cervix Per review of chart ASCUs pap in 04/2017 with no HPV Needs PP  pap  Preterm labor symptoms and general obstetric precautions including but not limited to vaginal bleeding, contractions, leaking of fluid and fetal movement were reviewed in detail with the patient. Please refer to After Visit Summary for other counseling recommendations.  Return in 2 weeks (on 12/05/2020) for [facility], ob visit.   Venora Maples, MD

## 2020-11-24 NOTE — L&D Delivery Note (Addendum)
Delivery Note At 9:14 AM a viable and healthy female was delivered via Vaginal, Spontaneous (Presentation: ROA).  APGAR: 8/9  Weight: Pending   Placenta status: Spontaneous, Intact.  Cord: 3 vessels with the following complications: None  Anesthesia:  Epidural Episiotomy:  No Lacerations:  Small right periurethral laceration that was hemostatic and did not require repair. Suture Repair: N/A Est. Blood Loss (mL): 50  Mom to postpartum.  Baby to Couplet care / Skin to Skin.  EMILY M GREEN 01/01/2021, 9:39 AM  Midwife attestation: I was gloved and present for delivery in its entirety and I agree with the above resident's note.  Donette Larry, CNM 11:30 AM

## 2020-11-26 ENCOUNTER — Other Ambulatory Visit: Payer: Self-pay | Admitting: *Deleted

## 2020-11-26 ENCOUNTER — Other Ambulatory Visit: Payer: Self-pay

## 2020-11-26 ENCOUNTER — Encounter: Payer: Self-pay | Admitting: *Deleted

## 2020-11-26 ENCOUNTER — Ambulatory Visit: Payer: BC Managed Care – PPO | Attending: Obstetrics and Gynecology

## 2020-11-26 ENCOUNTER — Ambulatory Visit: Payer: BC Managed Care – PPO | Admitting: *Deleted

## 2020-11-26 DIAGNOSIS — Z362 Encounter for other antenatal screening follow-up: Secondary | ICD-10-CM | POA: Diagnosis not present

## 2020-11-26 DIAGNOSIS — O358XX0 Maternal care for other (suspected) fetal abnormality and damage, not applicable or unspecified: Secondary | ICD-10-CM

## 2020-11-26 DIAGNOSIS — O0990 Supervision of high risk pregnancy, unspecified, unspecified trimester: Secondary | ICD-10-CM | POA: Insufficient documentation

## 2020-11-26 DIAGNOSIS — R8271 Bacteriuria: Secondary | ICD-10-CM

## 2020-11-26 DIAGNOSIS — O35HXX0 Maternal care for other (suspected) fetal abnormality and damage, fetal lower extremities anomalies, not applicable or unspecified: Secondary | ICD-10-CM

## 2020-11-26 DIAGNOSIS — O99213 Obesity complicating pregnancy, third trimester: Secondary | ICD-10-CM | POA: Diagnosis not present

## 2020-11-26 DIAGNOSIS — Z3A32 32 weeks gestation of pregnancy: Secondary | ICD-10-CM

## 2020-12-06 ENCOUNTER — Ambulatory Visit (INDEPENDENT_AMBULATORY_CARE_PROVIDER_SITE_OTHER): Payer: BC Managed Care – PPO | Admitting: Certified Nurse Midwife

## 2020-12-06 ENCOUNTER — Other Ambulatory Visit: Payer: Self-pay

## 2020-12-06 VITALS — BP 119/78 | HR 102 | Wt 163.9 lb

## 2020-12-06 DIAGNOSIS — J45909 Unspecified asthma, uncomplicated: Secondary | ICD-10-CM

## 2020-12-06 DIAGNOSIS — Z3493 Encounter for supervision of normal pregnancy, unspecified, third trimester: Secondary | ICD-10-CM

## 2020-12-06 DIAGNOSIS — O26893 Other specified pregnancy related conditions, third trimester: Secondary | ICD-10-CM

## 2020-12-06 DIAGNOSIS — O99513 Diseases of the respiratory system complicating pregnancy, third trimester: Secondary | ICD-10-CM

## 2020-12-06 DIAGNOSIS — R102 Pelvic and perineal pain: Secondary | ICD-10-CM

## 2020-12-06 DIAGNOSIS — Z3A34 34 weeks gestation of pregnancy: Secondary | ICD-10-CM

## 2020-12-06 DIAGNOSIS — O0990 Supervision of high risk pregnancy, unspecified, unspecified trimester: Secondary | ICD-10-CM

## 2020-12-06 MED ORDER — MAG-OXIDE 200 MG PO TABS: 400.0000 mg | ORAL_TABLET | Freq: Every day | ORAL | 3 refills | Status: DC

## 2020-12-06 MED ORDER — COMFORT FIT MATERNITY SUPP SM MISC: 1.0000 [IU] | Freq: Every day | 0 refills | Status: DC | PRN

## 2020-12-06 NOTE — Patient Instructions (Addendum)
Blood pressure cuff [facility] [address], [place] [address] Phone:  [phone]     PREGNANCY SUPPORT BELT: You are not alone, Seventy-five percent of women have some sort of abdominal or back pain at some point in their pregnancy. Your baby is growing at a fast pace, which means that your whole body is rapidly trying to adjust to the changes. As your uterus grows, your back may start feeling a bit under stress and this can result in back or abdominal pain that can go from mild, and therefore bearable, to severe pains that will not allow you to sit or lay down comfortably, When it comes to dealing with pregnancy-related pains and cramps, some pregnant women usually prefer natural remedies, which the market is filled with nowadays. For example, wearing a pregnancy support belt can help ease and lessen your discomfort and pain. WHAT ARE THE BENEFITS OF WEARING A PREGNANCY SUPPORT BELT? A pregnancy support belt provides support to the lower portion of the belly taking some of the weight of the growing uterus and distributing to the other parts of your body. It is designed make you comfortable and gives you extra support. Over the years, the pregnancy apparel market has been studying the needs and wants of pregnant women and they have come up with the most comfortable pregnancy support belts that woman could ever ask for. In fact, you will no longer have to wear a stretched-out or bulky pregnancy belt that is visible underneath your clothes and makes you feel even more uncomfortable. Nowadays, a pregnancy support belt is made of comfortable and stretchy materials that will not irritate your skin but will actually make you feel at ease and you will not even notice you are wearing it. They are easy to put on and adjust during the day and can be worn at night for additional support.  BENEFITS: . Relives Back pain . Relieves Abdominal Muscle and Leg Pain . Stabilizes the Pelvic  Ring . Offers a Cushioned Abdominal Lift Pad . Relieves pressure on the Sciatic Nerve Within Minutes  WHERE TO GET YOUR PREGNANCY BELT:  [facility] [address] [phone]  [facility] [address] [phone]

## 2020-12-07 MED ORDER — MAG-OXIDE 200 MG PO TABS: 400.0000 mg | ORAL_TABLET | Freq: Every day | ORAL | 3 refills | Status: DC

## 2020-12-07 MED ORDER — ALBUTEROL SULFATE HFA 108 (90 BASE) MCG/ACT IN AERS: 1.0000 | INHALATION_SPRAY | Freq: Four times a day (QID) | RESPIRATORY_TRACT | 2 refills | Status: AC | PRN

## 2020-12-07 NOTE — Addendum Note (Signed)
Addended by: Edd Arbour on: 12/07/2020 09:37 PM   Modules accepted: Orders

## 2020-12-07 NOTE — Progress Notes (Signed)
   PRENATAL VISIT NOTE  Subjective:  Deanna Patterson is a 25 y.o. G2P1001 at [redacted]w[redacted]d being seen today for ongoing prenatal care.  She is currently monitored for the following issues for this high-risk pregnancy and has Other fatigue; Intractable migraine with aura without status migrainosus; Vitamin D deficiency; Supervision of high risk pregnancy, antepartum; GBS bacteriuria; Asthma; Atypical squamous cells of undetermined significance (ASCUS) on Papanicolaou smear of cervix; and Vomiting or nausea of pregnancy on their problem list.  Patient reports pelvic and hip pain as well as exacerbated asthma. She is starting to experience more SOB on exertion.  Contractions: Irritability. Vag. Bleeding: None.  Movement: Present. Denies leaking of fluid.   The following portions of the patient's history were reviewed and updated as appropriate: allergies, current medications, past family history, past medical history, past social history, past surgical history and problem list.   Objective:   Vitals:   12/06/20 1612  BP: 119/78  Pulse: (!) 102  Weight: 163 lb 14.4 oz (74.3 kg)    Fetal Status:     Movement: Present     General:  Alert, oriented and cooperative. Patient is in no acute distress.  Skin: Skin is warm and dry. No rash noted.   Cardiovascular: Normal heart rate noted  Respiratory: Normal respiratory effort, no problems with respiration noted  Abdomen: Soft, gravid, appropriate for gestational age.  Pain/Pressure: Present     Pelvic: Cervical exam deferred        Extremities: Normal range of motion.  Edema: None  Mental Status: Normal mood and affect. Normal behavior. Normal judgment and thought content.   Assessment and Plan:  Pregnancy: G2P1001 at [redacted]w[redacted]d 1. Supervision of low risk pregnancy, third trimester - Pt doing well otherwise, looking forward to end of pregnancy and delivery  2. [redacted] weeks gestation of pregnancy - Routine OB care - Anticipatory guidance given regarding GBS  testing at next visit  3. Pelvic pain affecting pregnancy in third trimester, antepartum - Demonstrated round ligament massage and stretching for optimal fetal positioning - Encouraged FOB to give nightly back massages - Elastic Bandages & Supports (COMFORT FIT MATERNITY SUPP SM) MISC; 1 Units by Does not apply route daily as needed.  Dispense: 1 each; Refill: 0 - Magnesium Oxide (MAG-OXIDE) 200 MG TABS; Take 2 tablets (400 mg total) by mouth at bedtime. If that amount causes loose stools in the am, switch to 200mg  daily at bedtime.  Dispense: 60 tablet; Refill: 3  4. Asthma affecting pregnancy in third trimester - albuterol (VENTOLIN HFA) 108 (90 Base) MCG/ACT inhaler; Inhale 1-2 puffs into the lungs every 6 (six) hours as needed for wheezing or shortness of breath.  Dispense: 8 g; Refill: 2 - Check for effectiveness at next visit, increase to daily preventative inhaler if needed  Preterm labor symptoms and general obstetric precautions including but not limited to vaginal bleeding, contractions, leaking of fluid and fetal movement were reviewed in detail with the patient. Please refer to After Visit Summary for other counseling recommendations.   Return in about 2 weeks (around 12/20/2020) for IN-PERSON, LOB w GBS.  Future Appointments  Date Time Provider Department Center  12/20/2020  2:35 PM 12/22/2020 [facility]  12/24/2020  3:00 PM [facility] NURSE [facility]  12/24/2020  3:15 PM [facility] US3 [facility]    12/26/2020, CNM

## 2020-12-19 ENCOUNTER — Encounter (HOSPITAL_COMMUNITY): Payer: Self-pay | Admitting: Obstetrics & Gynecology

## 2020-12-19 ENCOUNTER — Inpatient Hospital Stay (HOSPITAL_COMMUNITY)
Admission: AD | Admit: 2020-12-19 | Discharge: 2020-12-19 | Disposition: A | Discharge status: Home / Self Care | Payer: BC Managed Care – PPO | Attending: Obstetrics & Gynecology | Admitting: Obstetrics & Gynecology

## 2020-12-19 DIAGNOSIS — Z3689 Encounter for other specified antenatal screening: Secondary | ICD-10-CM | POA: Diagnosis not present

## 2020-12-19 DIAGNOSIS — O4703 False labor before 37 completed weeks of gestation, third trimester: Secondary | ICD-10-CM

## 2020-12-19 DIAGNOSIS — Z3A36 36 weeks gestation of pregnancy: Secondary | ICD-10-CM

## 2020-12-19 DIAGNOSIS — O479 False labor, unspecified: Secondary | ICD-10-CM

## 2020-12-19 NOTE — Discharge Instructions (Signed)
The [employer]uit - Visit [email or web address] for instructions and pictures  This circuit takes at least 90 minutes to complete so clear your schedule and make mental preparations so you can relax in your environment. The second step requires a lot of pillows so gather them up before beginning Before starting, you should empty your bladder! Have a nice drink nearby, and make sure it has a straw! If you are having contractions, this circuit should be done through contractions, try not to change positions between steps Before you begin...  "I named this 'circuit' after my friend Deneen Harts, who shared and discussed it with me when I was working with a client whose labor seemed to be stalled out and no longer progressing... This circuit is useful to help get the baby lined up, ideally, in the "Left Occiput Anterior" (LOA) Position, both before labor begins and when some corrections need to be done during labor. Prenatally, this position set can help to rotate a baby. As a natural method of induction, this can help get things going if baby just needed a gentle nudge of position to set things off. To the best of my knowledge, this group of positions will not "hurt" a baby that is already lined up correctly." - Sharlyn Bologna   Step One: Open-knee Chest Stay in this position for 30 minutes, start in cat/cow, then drop your chest as low as you can to the bed or the floor and your bottom as high as you can. Knees should be fairly wide apart, and the angle between the torso/thighs should be wider than 90 degrees. Wiggle around, prop with lots of pillows and use this time to get totally relaxed. This position allows the baby to scoot out of the pelvis a bit and gives them room to rotate, shift their head position, etc. If the pregnant person finds it helpful, careful positioning with a rebozo under the belly, with gentle tension from a support person behind can help maintain this position for  the full 30 minutes.  Step ZOX:[id number] Left Side Lying Roll to your left side, bringing your top leg as high as possible and keeping your bottom leg straight. Roll forward as much as possible, again using a lot of pillows. Sink into the bed and relax some more. If you fall asleep, that's totally okay and you can stay there! If not, stay here for at least another half an hour. Try and get your top right leg up towards your head and get as rolled over onto your belly as much as possible. If you repeat the circuit during labor, try alternating left and right sides. We know the photo the left is actually right side... just flip the image in your head.  Step Three: Moving and Lunges Lunge, walk stairs facing sideways, 2 at a time, (have a spotter downstairs of you!), take a walk outside with one foot on the curb and the other on the street, sit on a birth ball and hula- anything that's upright and putting your pelvis in open, asymmetrical positions. Spend at least 30 minutes doing this one as well to give your baby a chance to move down. If you are lunging or stair or curb walking, you should lunge/walk/go up stairs in the direction that feels better to you. The key with the lunge is that the toes of the higher leg and mom's belly button should be at right angles. Do not lunge over your knee, that closes the pelvis.     Aundra Millet  [place] Miles: Tourist information centre manager - [email or web address] Sharlyn Bologna, CD, BDT (DONA), LCCE, FACCE: Supporting Content - [email or web address] Rulon Eisenmenger: Photography - [email or web address] Luther Hearing CD/CDT [facility]): Print and Webmaster - [email or web address] [employer] [email or web address]

## 2020-12-19 NOTE — MAU Note (Signed)
Irreg ctxs at 0400. At 1400 more regular ctxs started and continue this evening. Denies LOF or VB. Good FM

## 2020-12-19 NOTE — MAU Provider Note (Signed)
Event Date/Time   First Provider Initiated Contact with Patient 12/19/20 2027    S: [name] is a 25 y.o. G2P1001 at [redacted]w[redacted]d  who presents to MAU today complaining of irregular contractions since 0400 that became more regular (q 1.5-5 minutes since 1400). She denies vaginal bleeding. She denies LOF. She reports normal fetal movement.    O: BP 108/66 (BP Location: Left Arm)    Pulse 94    Temp 98.4 F (36.9 C)    Resp 17    Ht 5\' 1"  (1.549 m)    Wt 164 lb (74.4 kg)    LMP  (LMP Unknown) Comment: last depo Nov, no period since   SpO2 100%    BMI 30.99 kg/m  GENERAL: Well-developed, well-nourished female in no acute distress.  HEAD: Normocephalic, atraumatic.  CHEST: Normal effort of breathing, regular heart rate ABDOMEN: Soft, nontender, gravid  Cervical exam:  Dilation: Closed Presentation: Vertex Exam by:: [email or web address], RN  Fetal Monitoring: reactive Baseline: 145 Variability: moderate Accelerations: present (15x15) Decelerations: none Contractions: q1.5-47min but palpate mild and pt rates as regular but mild  A: SIUP at [redacted]w[redacted]d  False labor  P: Discharge home in stable condition with preterm labor precautions Follow up at [facility] on 12/20/20  12/22/20, CNM 12/19/2020 8:49 PM

## 2020-12-20 ENCOUNTER — Telehealth (INDEPENDENT_AMBULATORY_CARE_PROVIDER_SITE_OTHER): Payer: BC Managed Care – PPO | Admitting: Certified Nurse Midwife

## 2020-12-20 ENCOUNTER — Encounter: Payer: Self-pay | Admitting: Certified Nurse Midwife

## 2020-12-20 ENCOUNTER — Other Ambulatory Visit: Payer: Self-pay

## 2020-12-20 DIAGNOSIS — O99513 Diseases of the respiratory system complicating pregnancy, third trimester: Secondary | ICD-10-CM

## 2020-12-20 DIAGNOSIS — O99820 Streptococcus B carrier state complicating pregnancy: Secondary | ICD-10-CM

## 2020-12-20 DIAGNOSIS — J45909 Unspecified asthma, uncomplicated: Secondary | ICD-10-CM

## 2020-12-20 DIAGNOSIS — Z3A36 36 weeks gestation of pregnancy: Secondary | ICD-10-CM

## 2020-12-20 DIAGNOSIS — O0990 Supervision of high risk pregnancy, unspecified, unspecified trimester: Secondary | ICD-10-CM

## 2020-12-20 DIAGNOSIS — R8271 Bacteriuria: Secondary | ICD-10-CM

## 2020-12-20 NOTE — Patient Instructions (Signed)
Cervical Ripening (to get your cervix ready for labor) : May try one or all:  Red Raspberry Leaf capsules:  two 300mg  or 400mg  tablets with each meal, 2-3 times a day  Potential Side Effects Of Raspberry Leaf:  Most women do not experience any side effects from drinking raspberry leaf tea. However, nausea and loose stools are possible   Evening Primrose Oil capsules: may take 1 to 3 capsules daily. May also prick one to release the oil and insert it into your vagina at night.  Some of the potential side effects:  Upset stomach  Loose stools or diarrhea  Headaches  Nausea  4 Dates a day (may taste better if warmed in microwave until soft). Found where raisins are in the grocery store  [employer]

## 2020-12-20 NOTE — Progress Notes (Signed)
OBSTETRICS PRENATAL VIRTUAL VISIT ENCOUNTER NOTE  Provider location: [facility]   I connected with Deanna Patterson on 12/20/20 at  3:05 PM EST by MyChart Video Encounter at home and verified that I am speaking with the correct person using two identifiers.   I discussed the limitations, risks, security and privacy concerns of performing an evaluation and management service virtually and the availability of in person appointments. I also discussed with the patient that there may be a patient responsible charge related to this service. The patient expressed understanding and agreed to proceed. Subjective:  Deanna Patterson is a 25 y.o. G2P1001 at [redacted]w[redacted]d being seen today for ongoing prenatal care.  She is currently monitored for the following issues for this low-risk pregnancy and has Other fatigue; Intractable migraine with aura without status migrainosus; Vitamin D deficiency; Supervision of high risk pregnancy, antepartum; GBS bacteriuria; Asthma; Atypical squamous cells of undetermined significance (ASCUS) on Papanicolaou smear of cervix; and Vomiting or nausea of pregnancy on their problem list.  Patient reports occasional contractions.  Contractions: Irritability. Vag. Bleeding: None.  Movement: Present. Denies any leaking of fluid.   The following portions of the patient's history were reviewed and updated as appropriate: allergies, current medications, past family history, past medical history, past social history, past surgical history and problem list.   Objective:  There were no vitals filed for this visit.  Fetal Status:     Movement: Present     General:  Alert, oriented and cooperative. Patient is in no acute distress.  Respiratory: Normal respiratory effort, no problems with respiration noted  Mental Status: Normal mood and affect. Normal behavior. Normal judgment and thought content.  Rest of physical exam deferred due to type of  encounter  Imaging: [place] MFM OB FOLLOW UP  Result Date: 11/26/2020 ----------------------------------------------------------------------  OBSTETRICS REPORT                       (Signed Final 11/26/2020 12:17 pm) ---------------------------------------------------------------------- Patient Info  ID #:       [id number]                          D.O.B.:  Sep 25, 1996 (25 yrs)  Name:       Deanna Patterson                  Visit Date: 11/26/2020 07:32 am ---------------------------------------------------------------------- Performed By  Attending:        Noralee Space MD        Ref. Address:     [address]                                                             [place], [place]                                                             [address]  Performed By:     Birdena Crandall        Location:  [facility]                                 [facility]  Referred By:      [facility] ---------------------------------------------------------------------- Orders  #  Description                           Code        Ordered By  1  [place] MFM OB FOLLOW UP                   [phone]    Noralee Space ----------------------------------------------------------------------  #  Order #                     Accession #                Episode #  1  [id number]                   [id number]                 [id number] ---------------------------------------------------------------------- Indications  Obesity complicating pregnancy, second         O99.212  trimester (pregravid BMI 30.8)  Pyelectasis of fetus on prenatal ultrasound    O28.3  Encounter for other antenatal screening        Z36.2  follow-up  [redacted] weeks gestation of pregnancy                Z3A.32 ---------------------------------------------------------------------- Fetal  Evaluation  Num Of Fetuses:         1  Fetal Heart Rate(bpm):  150  Cardiac Activity:       Observed  Presentation:           Cephalic  Placenta:               Left lateral  P. Cord Insertion:      Visualized, central  Amniotic Fluid  AFI FV:      Within normal limits  AFI Sum(cm)     %Tile       Largest Pocket(cm)  12.09           33          4.6  RUQ(cm)       RLQ(cm)       LUQ(cm)        LLQ(cm)  1.67  4.6           2.35           3.47 ---------------------------------------------------------------------- Biometry  BPD:      78.8  mm     G. Age:  31w 4d         15  %    CI:        76.61   %    70 - 86                                                          FL/HC:      20.4   %    19.9 - 21.5  HC:      285.2  mm     G. Age:  31w 2d        1.7  %    HC/AC:      0.96        0.96 - 1.11  AC:      295.8  mm     G. Age:  33w 4d         75  %    FL/BPD:     74.0   %    71 - 87  FL:       58.3  mm     G. Age:  30w 3d        2.4  %    FL/AC:      19.7   %    20 - 24  Est. FW:    1938  gm      4 lb 4 oz     27  % ---------------------------------------------------------------------- OB History  Gravidity:    2         Term:   1        Prem:   0        SAB:   0  TOP:          0       Ectopic:  0        Living: 1 ---------------------------------------------------------------------- Gestational Age  U/S Today:     31w 5d                                        EDD:   01/23/21  Best:          32w 5d     Det. ByMarcella Dubs         EDD:   01/16/21                                      (05/24/20) ---------------------------------------------------------------------- Anatomy  Cranium:               Appears normal         LVOT:                   Appears normal  Cavum:                 Appears normal         Aortic Arch:  Previously seen  Ventricles:            Appears normal         Ductal Arch:            Previously seen  Choroid Plexus:        Previously seen        Diaphragm:              Appears normal   Cerebellum:            Previously seen        Stomach:                Appears normal, left                                                                        sided  Posterior Fossa:       Previously seen        Abdomen:                Appears normal  Nuchal Fold:           Not applicable (>20    Abdominal Wall:         Appears nml (cord                         wks GA)                                        insert, abd wall)  Face:                  Orbits and profile     Cord Vessels:           Appears normal (3                         previously seen                                vessel cord)  Lips:                  Previously seen        Kidneys:                Appear normal  Palate:                Previously seen        Bladder:                Appears normal  Thoracic:              Appears normal         Spine:                  Previously seen  Heart:                 Appears normal         Upper Extremities:      Previously seen                         (  4CH, axis, and                         situs)  RVOT:                  Previously seen        Lower Extremities:      Previously seen  Other:  Fetus appears to be female. Heels/feet and open hands/5th digits          prev visualized. Nasal bone prev visualized. VC, 3VV and 3VTV prev          visualized. ---------------------------------------------------------------------- Impression  Patient returns for fetal growth assessment.  She does not  have gestational diabetes.  On previous ultrasound short  female length was seen.  Amniotic fluid is normal and good fetal activity seen.  Fetal  growth is appropriate for gestational age.  Femur length  measurement is at less than the 5th percentile but interval  growth is seen.  Both kidneys appear normal with no  evidence of urinary tract dilations.  Mild pericardial effusion (5  mm) is seen.  No pericardial effusion at the level of AV valve  is seen.  Pericardial effusions between 2 and 7 mm are not associated  with  any adverse outcomes.  I called the patient left voicemail.  This study was remotely read . ---------------------------------------------------------------------- Recommendations  -An appointment was made for her to return in 4 weeks for  fetal growth assessment. ----------------------------------------------------------------------                  Noralee Spaceavi Shankar, MD Electronically Signed Final Report   11/26/2020 12:17 pm ----------------------------------------------------------------------   Assessment and Plan:  Pregnancy: G2P1001 at 4851w1d 1. Supervision of high risk pregnancy, antepartum - Routine prenatal care - Anticipatory guidance on upcoming appointments  - Patient doing well, reports occasional contractions - patient was seen in MAU last night for labor check, was closed  - encouraged patient to take BP and send through mychart   2. GBS bacteriuria - treat during labor  3. Uncomplicated asthma, unspecified asthma severity, unspecified whether persistent  4. [redacted] weeks gestation of pregnancy - labor precautions reviewed  - cervical ripening techniques reviewed    Preterm labor symptoms and general obstetric precautions including but not limited to vaginal bleeding, contractions, leaking of fluid and fetal movement were reviewed in detail with the patient. I discussed the assessment and treatment plan with the patient. The patient was provided an opportunity to ask questions and all were answered. The patient agreed with the plan and demonstrated an understanding of the instructions. The patient was advised to call back or seek an in-person office evaluation/[facility] for any urgent or concerning symptoms. Please refer to After Visit Summary for other counseling recommendations.   I provided 10 minutes of face-to-face time during this encounter.  Return in about 2 weeks (around 01/03/2021) for LROB, in person.  Future Appointments  Date Time Provider  Department Center  12/24/2020  3:00 PM [facility]  12/24/2020  3:15 PM [facility] US3 [facility]    Sharyon CableVeronica C Bonham Zingale, CNM [facility]

## 2020-12-20 NOTE — Progress Notes (Signed)
Pt states will take Blood Pressure later & record in My Chart.

## 2020-12-24 ENCOUNTER — Encounter: Payer: Self-pay | Admitting: *Deleted

## 2020-12-24 ENCOUNTER — Ambulatory Visit: Payer: BC Managed Care – PPO | Attending: Obstetrics and Gynecology

## 2020-12-24 ENCOUNTER — Ambulatory Visit: Payer: BC Managed Care – PPO | Admitting: *Deleted

## 2020-12-24 ENCOUNTER — Other Ambulatory Visit: Payer: Self-pay

## 2020-12-24 DIAGNOSIS — J45909 Unspecified asthma, uncomplicated: Secondary | ICD-10-CM | POA: Diagnosis present

## 2020-12-24 DIAGNOSIS — R8271 Bacteriuria: Secondary | ICD-10-CM | POA: Insufficient documentation

## 2020-12-24 DIAGNOSIS — E669 Obesity, unspecified: Secondary | ICD-10-CM

## 2020-12-24 DIAGNOSIS — O99513 Diseases of the respiratory system complicating pregnancy, third trimester: Secondary | ICD-10-CM | POA: Diagnosis not present

## 2020-12-24 DIAGNOSIS — Z362 Encounter for other antenatal screening follow-up: Secondary | ICD-10-CM

## 2020-12-24 DIAGNOSIS — O283 Abnormal ultrasonic finding on antenatal screening of mother: Secondary | ICD-10-CM

## 2020-12-24 DIAGNOSIS — O35HXX0 Maternal care for other (suspected) fetal abnormality and damage, fetal lower extremities anomalies, not applicable or unspecified: Secondary | ICD-10-CM

## 2020-12-24 DIAGNOSIS — O358XX0 Maternal care for other (suspected) fetal abnormality and damage, not applicable or unspecified: Secondary | ICD-10-CM | POA: Insufficient documentation

## 2020-12-24 DIAGNOSIS — Z3A36 36 weeks gestation of pregnancy: Secondary | ICD-10-CM

## 2020-12-24 DIAGNOSIS — O0990 Supervision of high risk pregnancy, unspecified, unspecified trimester: Secondary | ICD-10-CM

## 2020-12-24 DIAGNOSIS — O99213 Obesity complicating pregnancy, third trimester: Secondary | ICD-10-CM | POA: Diagnosis not present

## 2020-12-31 ENCOUNTER — Inpatient Hospital Stay (HOSPITAL_COMMUNITY)
Admission: AD | Admit: 2020-12-31 | Discharge: 2021-01-02 | DRG: 807 | Disposition: A | Discharge status: Home / Self Care | Payer: BC Managed Care – PPO | Attending: Obstetrics & Gynecology | Admitting: Obstetrics & Gynecology

## 2020-12-31 ENCOUNTER — Other Ambulatory Visit: Payer: Self-pay

## 2020-12-31 ENCOUNTER — Encounter (HOSPITAL_COMMUNITY): Payer: Self-pay | Admitting: Family Medicine

## 2020-12-31 DIAGNOSIS — R8271 Bacteriuria: Secondary | ICD-10-CM

## 2020-12-31 DIAGNOSIS — J45909 Unspecified asthma, uncomplicated: Secondary | ICD-10-CM | POA: Diagnosis present

## 2020-12-31 DIAGNOSIS — Z3A37 37 weeks gestation of pregnancy: Secondary | ICD-10-CM

## 2020-12-31 DIAGNOSIS — O0990 Supervision of high risk pregnancy, unspecified, unspecified trimester: Secondary | ICD-10-CM

## 2020-12-31 DIAGNOSIS — O9952 Diseases of the respiratory system complicating childbirth: Secondary | ICD-10-CM | POA: Diagnosis present

## 2020-12-31 DIAGNOSIS — Z20822 Contact with and (suspected) exposure to covid-19: Secondary | ICD-10-CM | POA: Diagnosis present

## 2020-12-31 DIAGNOSIS — Z880 Allergy status to penicillin: Secondary | ICD-10-CM | POA: Diagnosis not present

## 2020-12-31 DIAGNOSIS — O26893 Other specified pregnancy related conditions, third trimester: Secondary | ICD-10-CM | POA: Diagnosis present

## 2020-12-31 DIAGNOSIS — O99824 Streptococcus B carrier state complicating childbirth: Secondary | ICD-10-CM | POA: Diagnosis present

## 2020-12-31 LAB — SARS CORONAVIRUS 2 BY RT PCR (HOSPITAL ORDER, PERFORMED IN ~~LOC~~ HOSPITAL LAB): SARS Coronavirus 2: NEGATIVE

## 2020-12-31 LAB — CBC
HCT: 33.7 % — ABNORMAL LOW (ref 36.0–46.0)
Hemoglobin: 11.5 g/dL — ABNORMAL LOW (ref 12.0–15.0)
MCH: 32.2 pg (ref 26.0–34.0)
MCHC: 34.1 g/dL (ref 30.0–36.0)
MCV: 94.4 fL (ref 80.0–100.0)
Platelets: 205 10*3/uL (ref 150–400)
RBC: 3.57 MIL/uL — ABNORMAL LOW (ref 3.87–5.11)
RDW: 13.8 % (ref 11.5–15.5)
WBC: 8.3 10*3/uL (ref 4.0–10.5)
nRBC: 0 % (ref 0.0–0.2)

## 2020-12-31 MED ORDER — CEFAZOLIN SODIUM-DEXTROSE 1-4 GM/50ML-% IV SOLN
1.0000 g | Freq: Three times a day (TID) | INTRAVENOUS | Status: DC
  Administered 2021-01-01: 1 g via INTRAVENOUS
  Filled 2020-12-31 (×2): qty 50

## 2020-12-31 MED ORDER — ACETAMINOPHEN 325 MG PO TABS: 650.0000 mg | ORAL_TABLET | ORAL | Status: DC | PRN

## 2020-12-31 MED ORDER — OXYTOCIN BOLUS FROM INFUSION
333.0000 mL | Freq: Once | INTRAVENOUS | Status: AC
  Administered 2021-01-01: 333 mL via INTRAVENOUS

## 2020-12-31 MED ORDER — LIDOCAINE HCL (PF) 1 % IJ SOLN: 30.0000 mL | INTRAMUSCULAR | Status: DC | PRN

## 2020-12-31 MED ORDER — ONDANSETRON HCL 4 MG/2ML IJ SOLN: 4.0000 mg | Freq: Four times a day (QID) | INTRAMUSCULAR | Status: DC | PRN

## 2020-12-31 MED ORDER — LACTATED RINGERS IV SOLN: INTRAVENOUS | Status: DC

## 2020-12-31 MED ORDER — CEFAZOLIN SODIUM-DEXTROSE 2-4 GM/100ML-% IV SOLN
2.0000 g | Freq: Once | INTRAVENOUS | Status: AC
  Administered 2020-12-31: 2 g via INTRAVENOUS
  Filled 2020-12-31: qty 100

## 2020-12-31 MED ORDER — OXYTOCIN-SODIUM CHLORIDE 30-0.9 UT/500ML-% IV SOLN
2.5000 [IU]/h | INTRAVENOUS | Status: DC
  Filled 2020-12-31: qty 500

## 2020-12-31 MED ORDER — SOD CITRATE-CITRIC ACID 500-334 MG/5ML PO SOLN: 30.0000 mL | ORAL | Status: DC | PRN

## 2020-12-31 MED ORDER — FENTANYL CITRATE (PF) 100 MCG/2ML IJ SOLN: 50.0000 ug | INTRAMUSCULAR | Status: DC | PRN

## 2020-12-31 MED ORDER — LACTATED RINGERS IV SOLN: 500.0000 mL | INTRAVENOUS | Status: DC | PRN

## 2020-12-31 NOTE — H&P (Signed)
OBSTETRIC ADMISSION HISTORY AND PHYSICAL  Deanna Patterson is a 25 y.o. female G2P1001 with IUP at [redacted]w[redacted]d by early [place] presenting for labor. She reports +FMs, No LOF, no VB, no blurry vision, headaches or peripheral edema, and RUQ pain.  She plans on breast feeding. She request depo for birth control. She received her prenatal care at [facility]   Dating: By [redacted]w[redacted]d [place] --->  Estimated Date of Delivery: 01/16/21  Sono:    @[redacted]w[redacted]d , CWD, normal anatomy, cephalic presentation, left lateral placenta, 2558g, 14% EFW   Prenatal History/Complications:  -GBS positive, PCN allergy  -pyelectasis of fetus, not seen on most recent    Past Medical History: Past Medical History:  Diagnosis Date  . Asthma   . Migraines   . Vaginal bleeding in pregnancy, first trimester 05/30/2020    Past Surgical History: Past Surgical History:  Procedure Laterality Date  . NO PAST SURGERIES      Obstetrical History: OB History    Gravida  2   Para  1   Term  1   Preterm      AB      Living  1     SAB      IAB      Ectopic      Multiple      Live Births  1           Social History Social History   Socioeconomic History  . Marital status: Married    Spouse name: Not on file  . Number of children: Not on file  . Years of education: Not on file  . Highest education level: Not on file  Occupational History  . Not on file  Tobacco Use  . Smoking status: Never Smoker  . Smokeless tobacco: Never Used  Vaping Use  . Vaping Use: Never used  Substance and Sexual Activity  . Alcohol use: Not Currently    Alcohol/week: 0.0 standard drinks    Comment: wine daily until pregnancy  . Drug use: No  . Sexual activity: Yes    Birth control/protection: None  Other Topics Concern  . Not on file  Social History Narrative  . Not on file   Social Determinants of Health   Financial Resource Strain: Not on file  Food Insecurity: No Food Insecurity  . Worried About 07/31/2020 in the Last  Year: Never true  . Ran Out of Food in the Last Year: Never true  Transportation Needs: No Transportation Needs  . Lack of Transportation (Medical): No  . Lack of Transportation (Non-Medical): No  Physical Activity: Not on file  Stress: Not on file  Social Connections: Not on file    Family History: Family History  Problem Relation Age of Onset  . Hyperlipidemia Mother   . Hypertension Mother   . Thyroid disease Mother   . Hyperlipidemia Maternal Grandmother   . Hypertension Maternal Grandmother     Allergies: Allergies  Allergen Reactions  . Peanut-Containing Drug Products Rash, Shortness Of Breath and Swelling  . Eggs Or Egg-Derived Products   . Peanuts [Peanut Oil]   . Penicillins Rash    Medications Prior to Admission  Medication Sig Dispense Refill Last Dose  . albuterol (VENTOLIN HFA) 108 (90 Base) MCG/ACT inhaler Inhale 1-2 puffs into the lungs every 6 (six) hours as needed for wheezing or shortness of breath. 8 g 2   . Blood Pressure Monitoring DEVI 1 each by Does not apply route once a week. (  Patient not taking: No sig reported) 1 each 0   . Elastic Bandages & Supports (COMFORT FIT MATERNITY SUPP SM) MISC 1 Units by Does not apply route daily as needed. 1 each 0   . Magnesium Oxide (MAG-OXIDE) 200 MG TABS Take 2 tablets (400 mg total) by mouth at bedtime. If that amount causes loose stools in the am, switch to 200mg  daily at bedtime. 60 tablet 3   . Prenatal Vit-Fe Fumarate-FA (PRENATAL VITAMIN) 27-0.8 MG TABS Take 1 tablet by mouth daily. 90 tablet 3      Review of Systems   All systems reviewed and negative except as stated in HPI  Blood pressure 121/79, pulse 89, resp. rate 18, height 5\' 1"  (1.549 m), weight 76.2 kg, unknown if currently breastfeeding. General appearance: alert, cooperative and appears stated age Lungs: normal effort Heart: regular rate and rhythm Abdomen: soft, non-tender; bowel sounds normal Extremities: Homans sign is negative, no sign  of DVT Presentation: cephalic Fetal monitoring: baseline 145, mod variability, pos accel, no decel  Uterine activity q3-52min  Dilation: 5 Effacement (%): 80 Station: -1 Exam by:: K.Wilson,RN   Prenatal labs: ABO, Rh: A/Positive/-- (08/04 1413) Antibody: Negative (08/04 1413) Rubella: 1.37 (08/04 1413) RPR: Non Reactive (12/02 0842)  HBsAg: Negative (08/04 1413)  HIV: Non Reactive (12/02 0842)  GBS:   POS 2 hr Glucola normal Genetic screening  declined Anatomy 02-13-2005 fetal pylectasis, subsequently resolved   Prenatal Transfer Tool  Maternal Diabetes: No Genetic Screening: Declined Maternal Ultrasounds/Referrals: Fetal Kidney Anomalies, resolved on subsequent Fetal Ultrasounds or other Referrals:  None Maternal Substance Abuse:  No Significant Maternal Medications:  None Significant Maternal Lab Results: Group B Strep positive  No results found for this or any previous visit (from the past 24 hour(s)).  Patient Active Problem List   Diagnosis Date Noted  . Vomiting or nausea of pregnancy 09/06/2020  . Supervision of high risk pregnancy, antepartum 05/30/2020  . GBS bacteriuria 05/30/2020  . Asthma   . Other fatigue 03/29/2020  . Intractable migraine with aura without status migrainosus 03/29/2020  . Vitamin D deficiency 03/29/2020  . Atypical squamous cells of undetermined significance (ASCUS) on Papanicolaou smear of cervix 04/30/2017    Assessment/Plan:  Deanna Patterson is a 25 y.o. G2P1001 at [redacted]w[redacted]d here for labor.   #Labor: expectant mgmt for now. Anticipate SVD. #Pain: Pain meds, epidural prn #FWB: Cat I  #ID:  GBS pos, low risk PCN allergy, will start ancef.  #MOF: breast #MOC:depo #Circ:  Yes   22, MD  12/31/2020, 10:11 PM

## 2020-12-31 NOTE — Anesthesia Preprocedure Evaluation (Addendum)
Anesthesia Evaluation  Patient identified by MRN, date of birth, ID band Patient awake    Reviewed: Allergy & Precautions, NPO status , Patient's Chart, lab work & pertinent test results  Airway Mallampati: II  TM Distance: >3 FB Neck ROM: Full    Dental no notable dental hx. (+) Teeth Intact, Dental Advisory Given   Pulmonary asthma ,    Pulmonary exam normal breath sounds clear to auscultation       Cardiovascular Exercise Tolerance: Good negative cardio ROS Normal cardiovascular exam Rhythm:Regular Rate:Normal     Neuro/Psych  Headaches, negative neurological ROS     GI/Hepatic negative GI ROS, Neg liver ROS,   Endo/Other  negative endocrine ROS  Renal/GU negative Renal ROS     Musculoskeletal negative musculoskeletal ROS (+)   Abdominal   Peds  Hematology Lab Results      Component                Value               Date                      WBC                      8.3                 12/31/2020                HGB                      11.5 (L)            12/31/2020                HCT                      33.7 (L)            12/31/2020                MCV                      94.4                12/31/2020                PLT                      205                 12/31/2020              Anesthesia Other Findings   Reproductive/Obstetrics (+) Pregnancy                            Anesthesia Physical Anesthesia Plan  ASA: II  Anesthesia Plan: Epidural   Post-op Pain Management:    Induction:   PONV Risk Score and Plan:   Airway Management Planned:   Additional Equipment:   Intra-op Plan:   Post-operative Plan:   Informed Consent: I have reviewed the patients History and Physical, chart, labs and discussed the procedure including the risks, benefits and alternatives for the proposed anesthesia with the patient or authorized representative who has indicated his/her  understanding and acceptance.       Plan Discussed with:   Anesthesia Plan  Comments: (37.6 wk  G2P1 for LEA)       Anesthesia Quick Evaluation

## 2020-12-31 NOTE — MAU Note (Signed)
Ctx started about 2 hrs ago. Every few minutes and stronger now. Denies any vag bleeding or leaking at this time. Good fetal movement reported.

## 2021-01-01 ENCOUNTER — Inpatient Hospital Stay (HOSPITAL_COMMUNITY): Payer: BC Managed Care – PPO | Admitting: Anesthesiology

## 2021-01-01 DIAGNOSIS — Z3A37 37 weeks gestation of pregnancy: Secondary | ICD-10-CM

## 2021-01-01 LAB — TYPE AND SCREEN
ABO/RH(D): A POS
Antibody Screen: NEGATIVE

## 2021-01-01 LAB — RPR: RPR Ser Ql: NONREACTIVE

## 2021-01-01 MED ORDER — SENNOSIDES-DOCUSATE SODIUM 8.6-50 MG PO TABS
2.0000 | ORAL_TABLET | Freq: Every day | ORAL | Status: DC
  Administered 2021-01-02: 2 via ORAL
  Filled 2021-01-01: qty 2

## 2021-01-01 MED ORDER — BENZOCAINE-MENTHOL 20-0.5 % EX AERO: 1.0000 "application " | INHALATION_SPRAY | CUTANEOUS | Status: DC | PRN

## 2021-01-01 MED ORDER — PRENATAL MULTIVITAMIN CH
1.0000 | ORAL_TABLET | Freq: Every day | ORAL | Status: DC
  Administered 2021-01-02: 1 via ORAL
  Filled 2021-01-01: qty 1

## 2021-01-01 MED ORDER — DIBUCAINE (PERIANAL) 1 % EX OINT: 1.0000 "application " | TOPICAL_OINTMENT | CUTANEOUS | Status: DC | PRN

## 2021-01-01 MED ORDER — LIDOCAINE HCL (PF) 1 % IJ SOLN
INTRAMUSCULAR | Status: DC | PRN
  Administered 2021-01-01: 5 mL via EPIDURAL

## 2021-01-01 MED ORDER — ONDANSETRON HCL 4 MG/2ML IJ SOLN: 4.0000 mg | INTRAMUSCULAR | Status: DC | PRN

## 2021-01-01 MED ORDER — FENTANYL-BUPIVACAINE-NACL 0.5-0.125-0.9 MG/250ML-% EP SOLN
12.0000 mL/h | EPIDURAL | Status: DC | PRN
  Filled 2021-01-01: qty 250

## 2021-01-01 MED ORDER — DIPHENHYDRAMINE HCL 50 MG/ML IJ SOLN: 12.5000 mg | INTRAMUSCULAR | Status: DC | PRN

## 2021-01-01 MED ORDER — PHENYLEPHRINE 40 MCG/ML (10ML) SYRINGE FOR IV PUSH (FOR BLOOD PRESSURE SUPPORT): 80.0000 ug | PREFILLED_SYRINGE | INTRAVENOUS | Status: DC | PRN

## 2021-01-01 MED ORDER — WITCH HAZEL-GLYCERIN EX PADS: 1.0000 "application " | MEDICATED_PAD | CUTANEOUS | Status: DC | PRN

## 2021-01-01 MED ORDER — EPHEDRINE 5 MG/ML INJ: 10.0000 mg | INTRAVENOUS | Status: DC | PRN

## 2021-01-01 MED ORDER — SIMETHICONE 80 MG PO CHEW: 80.0000 mg | CHEWABLE_TABLET | ORAL | Status: DC | PRN

## 2021-01-01 MED ORDER — IBUPROFEN 600 MG PO TABS
600.0000 mg | ORAL_TABLET | Freq: Four times a day (QID) | ORAL | Status: DC
  Administered 2021-01-01 – 2021-01-02 (×5): 600 mg via ORAL
  Filled 2021-01-01 (×4): qty 1

## 2021-01-01 MED ORDER — LACTATED RINGERS IV SOLN: 500.0000 mL | Freq: Once | INTRAVENOUS | Status: DC

## 2021-01-01 MED ORDER — COCONUT OIL OIL
1.0000 | TOPICAL_OIL | Status: DC | PRN
Start: 2021-01-01 — End: 2021-01-02

## 2021-01-01 MED ORDER — FENTANYL-BUPIVACAINE-NACL 0.5-0.125-0.9 MG/250ML-% EP SOLN
EPIDURAL | Status: DC | PRN
  Administered 2021-01-01: 12 mL/h via EPIDURAL

## 2021-01-01 MED ORDER — ACETAMINOPHEN 325 MG PO TABS: 650.0000 mg | ORAL_TABLET | ORAL | Status: DC | PRN

## 2021-01-01 MED ORDER — MEDROXYPROGESTERONE ACETATE 150 MG/ML IM SUSP
150.0000 mg | Freq: Once | INTRAMUSCULAR | Status: AC
  Administered 2021-01-02: 150 mg via INTRAMUSCULAR
  Filled 2021-01-01: qty 1

## 2021-01-01 MED ORDER — DIPHENHYDRAMINE HCL 25 MG PO CAPS: 25.0000 mg | ORAL_CAPSULE | Freq: Four times a day (QID) | ORAL | Status: DC | PRN

## 2021-01-01 MED ORDER — ONDANSETRON HCL 4 MG PO TABS: 4.0000 mg | ORAL_TABLET | ORAL | Status: DC | PRN

## 2021-01-01 MED ORDER — TETANUS-DIPHTH-ACELL PERTUSSIS 5-2.5-18.5 LF-MCG/0.5 IM SUSY: 0.5000 mL | PREFILLED_SYRINGE | Freq: Once | INTRAMUSCULAR | Status: DC

## 2021-01-01 NOTE — Lactation Note (Signed)
This note was copied from a baby's chart. Lactation Consultation Note  Patient Name: Girl Jerre Diguglielmo Today's Date: 01/01/2021 Reason for consult: L&D Initial assessment Age:26 hours LC to L&D. This is an experienced breastfeeding mom who reports comfort with positioning/latch. She denies pain during current feeding. We reviewed benefits of bf and expectations in 1st 24 hours. Parents asked several questions. All concerns were addressed. Will plan f/u visit when mom and baby transfer to [facility]. Maternal Data Has patient been taught Hand Expression?: No Does the patient have breastfeeding experience prior to this delivery?: Yes How long did the patient breastfeed?: 2 years  Feeding Mother's Current Feeding Choice: Breast Milk  LATCH Score Latch: Repeated attempts needed to sustain latch, nipple held in mouth throughout feeding, stimulation needed to elicit sucking reflex.  Audible Swallowing: A few with stimulation  Type of Nipple: Everted at rest and after stimulation  Comfort (Breast/Nipple): Soft / non-tender  Hold (Positioning): Assistance needed to correctly position infant at breast and maintain latch.  LATCH Score: 7 Latch scored by RN.  LC observed feeding with rhythmic suckling/swallowing.   Consult Status Consult Status: Follow-up Follow-up type: In-patient   Elder Negus, MA IBCLC 01/01/2021, 10:13 AM

## 2021-01-01 NOTE — Anesthesia Postprocedure Evaluation (Signed)
Anesthesia Post Note  Patient: Deanna Patterson  Procedure(s) Performed: AN AD HOC LABOR EPIDURAL     Patient location during evaluation: Mother Baby Anesthesia Type: Epidural Level of consciousness: awake and alert Pain management: pain level controlled Vital Signs Assessment: post-procedure vital signs reviewed and stable Respiratory status: spontaneous breathing, nonlabored ventilation and respiratory function stable Cardiovascular status: stable Postop Assessment: no headache, no backache, no apparent nausea or vomiting, patient able to bend at knees, adequate PO intake and able to ambulate Anesthetic complications: no   No complications documented.  Last Vitals:  Vitals:   01/01/21 1205 01/01/21 1527  BP: 115/78 117/73  Pulse: 74 75  Resp:  18  Temp: 36.7 C   SpO2:  100%    Last Pain:  Vitals:   01/01/21 1527  TempSrc:   PainSc: 0-No pain   Pain Goal:                   Blythe Stanford

## 2021-01-01 NOTE — Discharge Summary (Signed)
Postpartum Discharge Summary    Patient Name: Deanna Patterson DOB: 22-Oct-1996 MRN: [id number]  Date of admission: 12/31/2020 Delivery date:01/01/2021  Delivering provider: Lenna Sciara  Date of discharge: 01/02/2021  Admitting diagnosis: Normal labor [O80, Z37.9] Intrauterine pregnancy: [redacted]w[redacted]d    Secondary diagnosis:  Active Problems:   Indication for care in labor or delivery   SVD (spontaneous vaginal delivery)  Additional problems: GBS pos, fetal pyelectasis    Discharge diagnosis: Term Pregnancy Delivered                                              Post partum procedures:depo Augmentation: AROM Complications: None  Hospital course: Onset of Labor With Vaginal Delivery      25y.o. yo G2P1001 at 321w6das admitted in Active Labor on 12/31/2020. Patient had an uncomplicated labor course as follows:  Membrane Rupture Time/Date: 6:40 AM ,01/01/2021   Delivery Method:Vaginal, Spontaneous  Episiotomy: None  Lacerations:  Periurethral  Patient had an uncomplicated postpartum course.  She is ambulating, tolerating a regular diet, passing flatus, and urinating well. Patient is discharged home in stable condition on 01/02/21.  Newborn Data: Birth date:01/01/2021  Birth time:9:14 AM  Gender:Female  Living status:Living  Apgars:8 ,9  Weight:2656 g   Magnesium Sulfate received: No BMZ received: No Rhophylac:N/A MMR:N/A T-DaP:Given prenatally Flu: No Transfusion:No  Physical exam  Vitals:   01/01/21 1527 01/01/21 2000 01/01/21 2351 01/02/21 0531  BP: 117/73 122/83 116/86 111/81  Pulse: 75 79 70 89  Resp: 18 18 18 16   Temp:  98.6 F (37 C) 98.7 F (37.1 C) 97.7 F (36.5 C)  TempSrc:  Oral Oral Oral  SpO2: 100% 100% 99% 100%  Weight:      Height:       General: alert, cooperative and no distress Lochia: appropriate Uterine Fundus: firm Incision: N/A DVT Evaluation: No evidence of DVT seen on physical exam. Labs: Lab Results  Component Value Date   WBC 8.3 12/31/2020    HGB 11.5 (L) 12/31/2020   HCT 33.7 (L) 12/31/2020   MCV 94.4 12/31/2020   PLT 205 12/31/2020   CMP Latest Ref Rng & Units 06/14/2020  Glucose 70 - 99 mg/dL 107(H)  BUN 6 - 20 mg/dL 6  Creatinine 0.44 - 1.00 mg/dL 0.59  Sodium 135 - 145 mmol/L 137  Potassium 3.5 - 5.1 mmol/L 4.0  Chloride 98 - 111 mmol/L 106  CO2 22 - 32 mmol/L 23  Calcium 8.9 - 10.3 mg/dL 9.4  Total Protein 6.5 - 8.1 g/dL 6.6  Total Bilirubin 0.3 - 1.2 mg/dL 0.7  Alkaline Phos 38 - 126 U/L 46  AST 15 - 41 U/L 24  ALT 0 - 44 U/L 35   Edinburgh Score: Edinburgh Postnatal Depression Scale Screening Tool 01/01/2021  I have been able to laugh and see the funny side of things. 0  I have looked forward with enjoyment to things. 0  I have blamed myself unnecessarily when things went wrong. 0  I have been anxious or worried for no good reason. 0  I have felt scared or panicky for no good reason. 0  Things have been getting on top of me. 0  I have been so unhappy that I have had difficulty sleeping. 0  I have felt sad or miserable. 0  I have been so unhappy that I  have been crying. 0  The thought of harming myself has occurred to me. 0  Edinburgh Postnatal Depression Scale Total 0     After visit meds:  Allergies as of 01/02/2021      Reactions   Peanut-containing Drug Products Rash, Shortness Of Breath, Swelling   Eggs Or Egg-derived Products Swelling   Swelling of the lips, tongue and throat.   Peanuts [peanut Oil] Swelling   Swelling of the lips, tongue and throat.   Penicillins Rash      Medication List    TAKE these medications   acetaminophen 325 MG tablet Commonly known as: Tylenol Take 2 tablets (650 mg total) by mouth every 4 (four) hours as needed (for pain scale < 4).   albuterol 108 (90 Base) MCG/ACT inhaler Commonly known as: VENTOLIN HFA Inhale 1-2 puffs into the lungs every 6 (six) hours as needed for wheezing or shortness of breath.   benzocaine-Menthol 20-0.5 % Aero Commonly known as:  DERMOPLAST Apply 1 application topically as needed for irritation (perineal discomfort).   Blood Pressure Monitoring Devi 1 each by Does not apply route once a week.   coconut oil Oil Apply 1 application topically as needed.   Comfort Fit Maternity Supp Sm Misc 1 Units by Does not apply route daily as needed.   ibuprofen 600 MG tablet Commonly known as: ADVIL Take 1 tablet (600 mg total) by mouth every 6 (six) hours.   PrePLUS 27-1 MG Tabs Take 1 tablet by mouth daily.        Discharge home in stable condition Infant Feeding: Breast Infant Disposition:home with mother Discharge instruction: per After Visit Summary and Postpartum booklet. Activity: Advance as tolerated. Pelvic rest for 6 weeks.  Diet: routine diet Future Appointments: Future Appointments  Date Time Provider [place]  01/29/2021  1:35 PM Virginia Rochester, NP [facility]   Follow up Visit:   Please schedule this patient for a Virtual postpartum visit in 4 weeks with the following provider: Any provider. Additional Postpartum F/U:n/a  Low risk pregnancy complicated by: n/a Delivery mode:  Vaginal, Spontaneous  Anticipated Birth Control:  Depo   01/02/2021 Janet Berlin, MD

## 2021-01-01 NOTE — Anesthesia Procedure Notes (Signed)
Epidural Patient location during procedure: OB Start time: 01/01/2021 3:07 AM End time: 01/01/2021 3:35 AM  Staffing Anesthesiologist: Trevor Iha, MD Performed: anesthesiologist   Preanesthetic Checklist Completed: patient identified, IV checked, site marked, risks and benefits discussed, surgical consent, monitors and equipment checked, pre-op evaluation and timeout performed  Epidural Patient position: sitting Prep: DuraPrep and site prepped and draped Patient monitoring: continuous pulse ox and blood pressure Approach: midline Location: L3-L4 Injection technique: LOR air  Needle:  Needle type: Tuohy  Needle gauge: 17 G Needle length: 9 cm and 9 Needle insertion depth: 7 cm Catheter type: closed end flexible Catheter size: 19 Gauge Catheter at skin depth: 12 cm Test dose: negative  Assessment Events: blood not aspirated, injection not painful, no injection resistance, no paresthesia and negative IV test  Additional Notes Patient identified. Risks/Benefits/Options discussed with patient including but not limited to bleeding, infection, nerve damage, paralysis, failed block, incomplete pain control, headache, blood pressure changes, nausea, vomiting, reactions to medication both or allergic, itching and postpartum back pain. Confirmed with bedside nurse the patient's most recent platelet count. Confirmed with patient that they are not currently taking any anticoagulation, have any bleeding history or any family history of bleeding disorders. Patient expressed understanding and wished to proceed. All questions were answered. Sterile technique was used throughout the entire procedure. Please see nursing notes for vital signs. Test dose was given through epidural needle and negative prior to continuing to dose epidural or start infusion. Warning signs of high block given to the patient including shortness of breath, tingling/numbness in hands, complete motor block, or any concerning  symptoms with instructions to call for help. Patient was given instructions on fall risk and not to get out of bed. All questions and concerns addressed with instructions to call with any issues.  1 Attempt (S) . Patient tolerated procedure well.

## 2021-01-01 NOTE — Progress Notes (Signed)
Labor Progress Note Deanna Patterson is a 25 y.o. G2P1001 at [redacted]w[redacted]d presented for early labor   S: Feeling contractions less   O:  BP (!) 100/57   Pulse 66   Temp 97.8 F (36.6 C) (Oral)   Resp 18   Ht 5\' 1"  (1.549 m)   Wt 76.2 kg   LMP  (LMP Unknown) Comment: last depo Nov, no period since  SpO2 96%   BMI 31.74 kg/m  EFM: 120/mod variability/pos accels/no decels   CVE: Dilation: 5 Effacement (%): 60 Cervical Position: Middle Station: 0 Presentation: Vertex Exam by:: Dr. [email or web address]   A&P: 25 y.o. G2P1001 [redacted]w[redacted]d here in labor  #Labor: now has received adequate GBS ppx. Contractions spaced after epidural. AROM for clear fluid. Anticipate SVD #Pain: epidural #FWB: cat I  #GBS positive, adequate ppx     [redacted]w[redacted]d, MD 6:58 AM

## 2021-01-02 ENCOUNTER — Encounter (HOSPITAL_COMMUNITY): Payer: Self-pay | Admitting: *Deleted

## 2021-01-02 MED ORDER — BENZOCAINE-MENTHOL 20-0.5 % EX AERO: 1.0000 "application " | INHALATION_SPRAY | CUTANEOUS | 0 refills | Status: DC | PRN

## 2021-01-02 MED ORDER — COCONUT OIL OIL: 1.0000 "application " | TOPICAL_OIL | 0 refills | Status: DC | PRN

## 2021-01-02 MED ORDER — PREPLUS 27-1 MG PO TABS: 1.0000 | ORAL_TABLET | Freq: Every day | ORAL | 13 refills | Status: DC

## 2021-01-02 MED ORDER — IBUPROFEN 600 MG PO TABS: 600.0000 mg | ORAL_TABLET | Freq: Four times a day (QID) | ORAL | 0 refills | Status: DC

## 2021-01-02 MED ORDER — ACETAMINOPHEN 325 MG PO TABS: 650.0000 mg | ORAL_TABLET | ORAL | Status: DC | PRN

## 2021-01-03 ENCOUNTER — Encounter: Payer: BC Managed Care – PPO | Admitting: Certified Nurse Midwife

## 2021-01-29 ENCOUNTER — Telehealth (INDEPENDENT_AMBULATORY_CARE_PROVIDER_SITE_OTHER): Payer: BC Managed Care – PPO | Admitting: Nurse Practitioner

## 2021-01-29 DIAGNOSIS — Z5329 Procedure and treatment not carried out because of patient's decision for other reasons: Secondary | ICD-10-CM

## 2021-01-29 DIAGNOSIS — Z91199 Patient's noncompliance with other medical treatment and regimen due to unspecified reason: Secondary | ICD-10-CM

## 2021-01-29 NOTE — Progress Notes (Signed)
1:47p-Called Pt for My Chart visit, no answer, leftVM that will call back in 10 to 15 minutes.   2;11p- 2nd attempt, no answer, left VM to reschedule.

## 2021-03-20 ENCOUNTER — Other Ambulatory Visit: Payer: Self-pay

## 2021-03-20 ENCOUNTER — Ambulatory Visit (INDEPENDENT_AMBULATORY_CARE_PROVIDER_SITE_OTHER): Payer: BC Managed Care – PPO | Admitting: Family Medicine

## 2021-03-20 ENCOUNTER — Encounter: Payer: Self-pay | Admitting: Family Medicine

## 2021-03-20 DIAGNOSIS — Z30013 Encounter for initial prescription of injectable contraceptive: Secondary | ICD-10-CM | POA: Diagnosis not present

## 2021-03-20 MED ORDER — MEDROXYPROGESTERONE ACETATE 150 MG/ML IM SUSP
150.0000 mg | Freq: Once | INTRAMUSCULAR | Status: AC
  Administered 2021-03-20: 150 mg via INTRAMUSCULAR

## 2021-03-20 NOTE — Progress Notes (Signed)
Post Partum Visit Note  Yarimar Lavis is a 25 y.o. G31P1001 female who presents for a postpartum visit. She is 11 weeks postpartum following a normal spontaneous vaginal delivery.  I have fully reviewed the prenatal and intrapartum course. The delivery was at [redacted]w[redacted]d gestational weeks.  Anesthesia: epidural. Postpartum course has been uneventful. Baby is doing well. Baby is feeding by breast. Bleeding staining only. Bowel function is normal. Bladder function is normal. Patient is sexually active. Contraception method is Depo-Provera injections. Postpartum depression screening: negative.   The pregnancy intention screening data noted above was reviewed. Potential methods of contraception were discussed. The patient elected to proceed with Hormonal Injection.    Edinburgh Postnatal Depression Scale - 03/20/21 1323      Edinburgh Postnatal Depression Scale:  In the Past 7 Days   I have been able to laugh and see the funny side of things. 0    I have looked forward with enjoyment to things. 0    I have blamed myself unnecessarily when things went wrong. 0    I have been anxious or worried for no good reason. 0    I have felt scared or panicky for no good reason. 0    Things have been getting on top of me. 0    I have been so unhappy that I have had difficulty sleeping. 0    I have felt sad or miserable. 0    I have been so unhappy that I have been crying. 0    The thought of harming myself has occurred to me. 0    Edinburgh Postnatal Depression Scale Total 0           Health Maintenance Due  Topic Date Due  . COVID-19 Vaccine (1) Never done  . HPV VACCINES (1 - 2-dose series) Never done  . PAP SMEAR-Modifier  11/25/2018    The following portions of the patient's history were reviewed and updated as appropriate: allergies, current medications, past family history, past medical history, past social history, past surgical history and problem list.  Review of Systems Pertinent items  noted in HPI and remainder of comprehensive ROS otherwise negative.  Objective:  BP 123/83   Pulse 75   Ht 5' (1.524 m)   Wt 146 lb 6.4 oz (66.4 kg)   Breastfeeding Yes   BMI 28.59 kg/m    General:  alert, cooperative and appears stated age  Lungs: comfortable on room air  Wound Not applicable  GU exam:  declined       Assessment:    There are no diagnoses linked to this encounter.  Normal postpartum exam.   Plan:   Essential components of care per ACOG recommendations:  1.  Mood and well being: Patient with negative depression screening today. Reviewed local resources for support.  - Patient does not use tobacco.  - hx of drug use? No    2. Infant care and feeding:  -Patient currently breastmilk feeding? Yes  -Social determinants of health (SDOH) reviewed in EPIC. No concerns  3. Sexuality, contraception and birth spacing - Patient does not want a pregnancy in the next year.  Desired family size is 3 children.  - Reviewed forms of contraception in tiered fashion. Patient desired Depo-Provera today.  Would like to do it q2 months as she has done this in the past to control bleeding. Discussed that this may significantly increase her return to fertility in the future due to stacking nature of Depo injections.  -  Discussed birth spacing of 18 months  4. Sleep and fatigue -Encouraged family/partner/community support of 4 hrs of uninterrupted sleep to help with mood and fatigue  5. Physical Recovery  - Discussed patients delivery and complications - Patient had a Vaginal, no problems at delivery. Perineal healing reviewed. Patient expressed understanding - Patient has urinary incontinence? No - Patient is safe to resume physical and sexual activity  6.  Health Maintenance - HM due items addressed No - patient declined pap due to time constraints - Last pap smear done 04/24/2017 and was abnormal with ASCUS with no HPV result. Pap smear not done at today's visit. Emphasized  repeatedly the importance of screening for cervical cancer, patient understands importance and will return for pap smear.  - Mammogram: n/a  7. Chronic Disease - PCP follow up  Venora Maples, MD Center for [facility], [facility], [facility]

## 2021-03-20 NOTE — Patient Instructions (Signed)
Preventive Care 21-26 Years Old, Female Preventive care refers to lifestyle choices and visits with your health care provider that can promote health and wellness. This includes:  A yearly physical exam. This is also called an annual wellness visit.  Regular dental and eye exams.  Immunizations.  Screening for certain conditions.  Healthy lifestyle choices, such as: ? Eating a healthy diet. ? Getting regular exercise. ? Not using drugs or products that contain nicotine and tobacco. ? Limiting alcohol use. What can I expect for my preventive care visit? Physical exam Your health care provider may check your:  Height and weight. These may be used to calculate your BMI (body mass index). BMI is a measurement that tells if you are at a healthy weight.  Heart rate and blood pressure.  Body temperature.  Skin for abnormal spots. Counseling Your health care provider may ask you questions about your:  Past medical problems.  Family's medical history.  Alcohol, tobacco, and drug use.  Emotional well-being.  Home life and relationship well-being.  Sexual activity.  Diet, exercise, and sleep habits.  Work and work environment.  Access to firearms.  Method of birth control.  Menstrual cycle.  Pregnancy history. What immunizations do I need? Vaccines are usually given at various ages, according to a schedule. Your health care provider will recommend vaccines for you based on your age, medical history, and lifestyle or other factors, such as travel or where you work.   What tests do I need? Blood tests  Lipid and cholesterol levels. These may be checked every 5 years starting at age 20.  Hepatitis C test.  Hepatitis B test. Screening  Diabetes screening. This is done by checking your blood sugar (glucose) after you have not eaten for a while (fasting).  STD (sexually transmitted disease) testing, if you are at risk.  BRCA-related cancer screening. This may be  done if you have a family history of breast, ovarian, tubal, or peritoneal cancers.  Pelvic exam and Pap test. This may be done every 3 years starting at age 21. Starting at age 30, this may be done every 5 years if you have a Pap test in combination with an HPV test. Talk with your health care provider about your test results, treatment options, and if necessary, the need for more tests.   Follow these instructions at home: Eating and drinking  Eat a healthy diet that includes fresh fruits and vegetables, whole grains, lean protein, and low-fat dairy products.  Take vitamin and mineral supplements as recommended by your health care provider.  Do not drink alcohol if: ? Your health care provider tells you not to drink. ? You are pregnant, may be pregnant, or are planning to become pregnant.  If you drink alcohol: ? Limit how much you have to 0-1 drink a day. ? Be aware of how much alcohol is in your drink. In the [place] one drink equals one 12 oz bottle of beer (355 mL), one 5 oz glass of wine (148 mL), or one 1 oz glass of hard liquor (44 mL).   Lifestyle  Take daily care of your teeth and gums. Brush your teeth every morning and night with fluoride toothpaste. Floss one time each day.  Stay active. Exercise for at least 30 minutes 5 or more days each week.  Do not use any products that contain nicotine or tobacco, such as cigarettes, e-cigarettes, and chewing tobacco. If you need help quitting, ask your health care provider.  Do not   use drugs.  If you are sexually active, practice safe sex. Use a condom or other form of protection to prevent STIs (sexually transmitted infections).  If you do not wish to become pregnant, use a form of birth control. If you plan to become pregnant, see your health care provider for a prepregnancy visit.  Find healthy ways to cope with stress, such as: ? Meditation, yoga, or listening to music. ? Journaling. ? Talking to a trusted  person. ? Spending time with friends and family. Safety  Always wear your seat belt while driving or riding in a vehicle.  Do not drive: ? If you have been drinking alcohol. Do not ride with someone who has been drinking. ? When you are tired or distracted. ? While texting.  Wear a helmet and other protective equipment during sports activities.  If you have firearms in your house, make sure you follow all gun safety procedures.  Seek help if you have been physically or sexually abused. What's next?  Go to your health care provider once a year for an annual wellness visit.  Ask your health care provider how often you should have your eyes and teeth checked.  Stay up to date on all vaccines. This information is not intended to replace advice given to you by your health care provider. Make sure you discuss any questions you have with your health care provider. Document Revised: 07/08/2020 Document Reviewed: 07/22/2018 Elsevier Patient Education  2021 Elsevier Inc.  

## 2021-04-10 ENCOUNTER — Encounter: Payer: Self-pay | Admitting: Family Medicine

## 2021-05-24 ENCOUNTER — Ambulatory Visit: Payer: BC Managed Care – PPO | Admitting: Medical

## 2021-05-24 IMAGING — US US MFM OB FOLLOW-UP
1 series · 14 of 28 positions shown · non-contrast
Comparison: none

[Series 1: us mfm ob follow-up · 63 acquisitions, 14 frames shown]
[im 3/63]
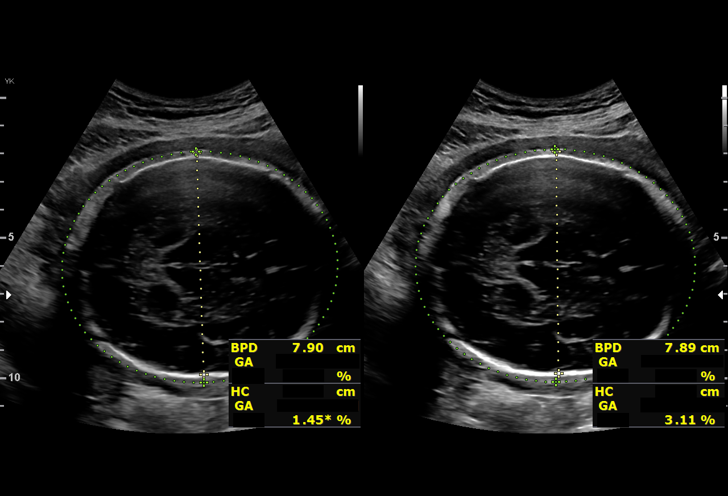
[im 7/63]
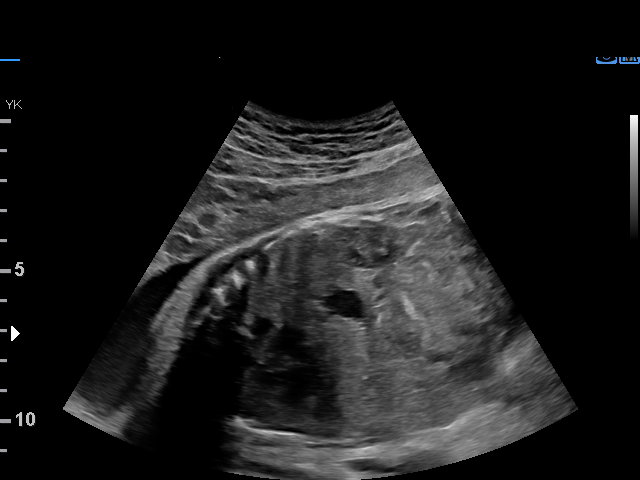
[im 12/63]
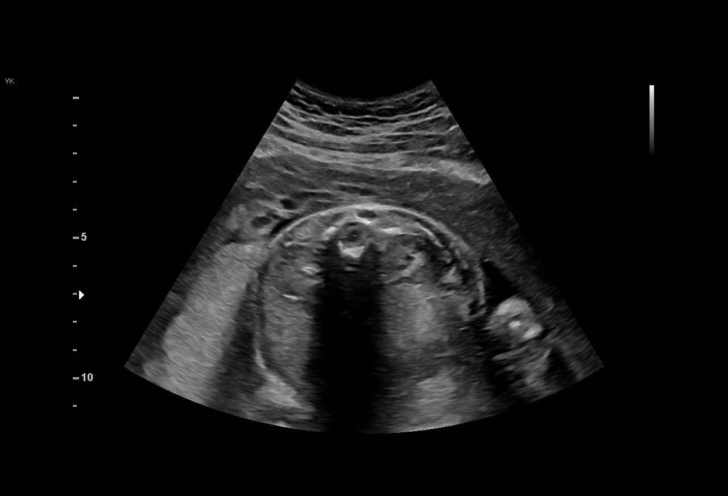
[im 17/63]
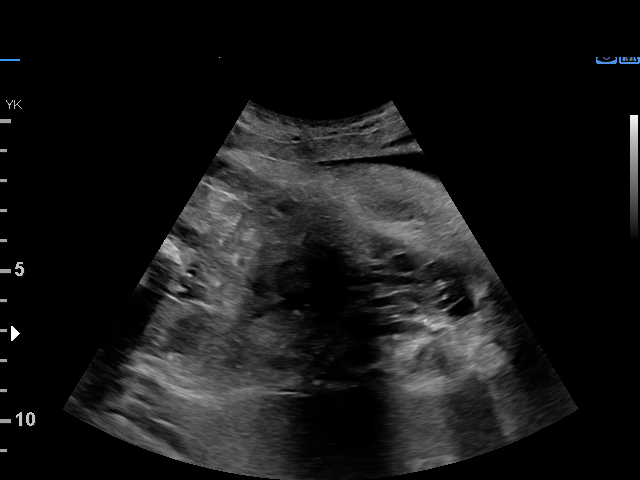
[im 21/63]
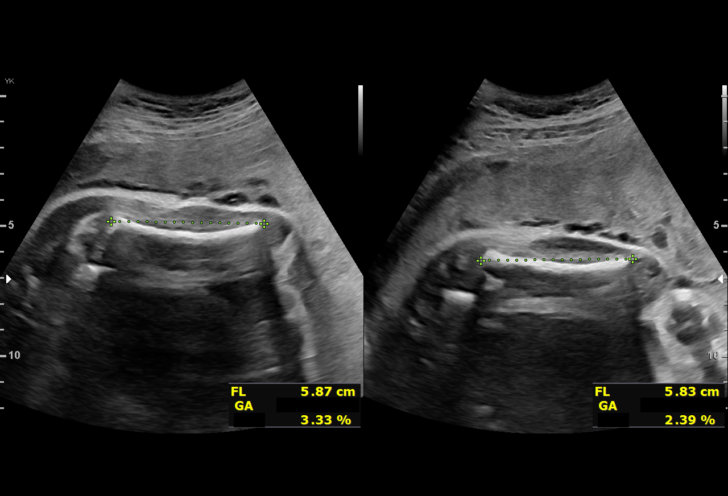
[im 26/63]
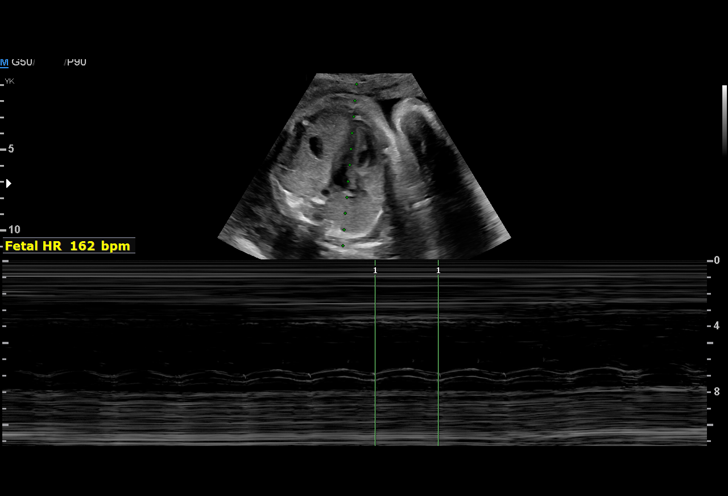
[im 30/63]
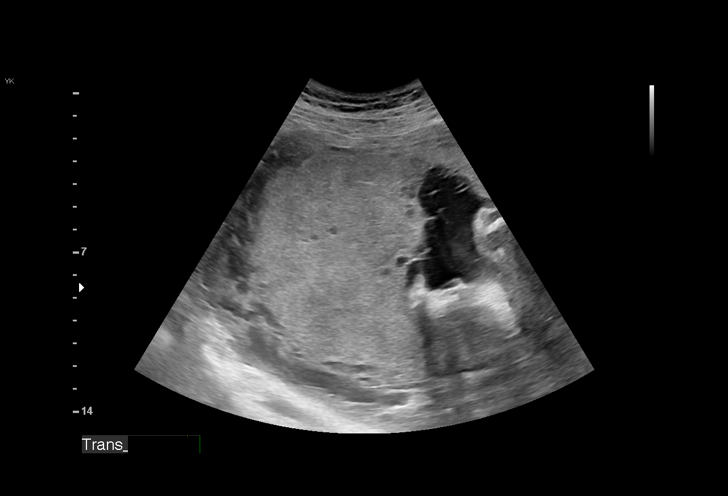
[im 35/63]
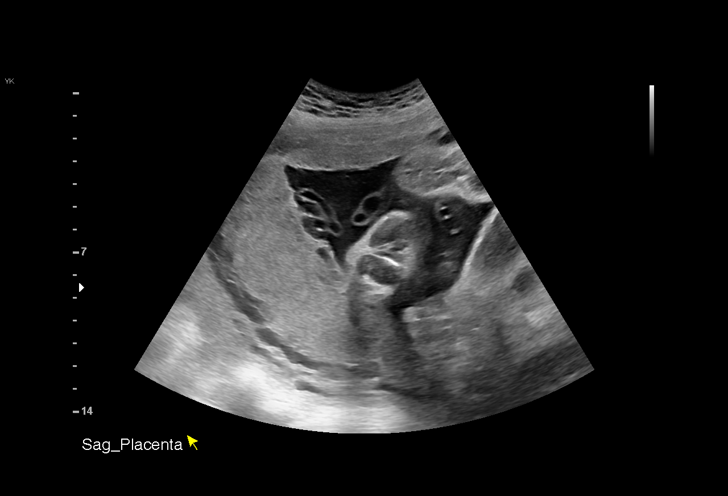
[im 40/63]
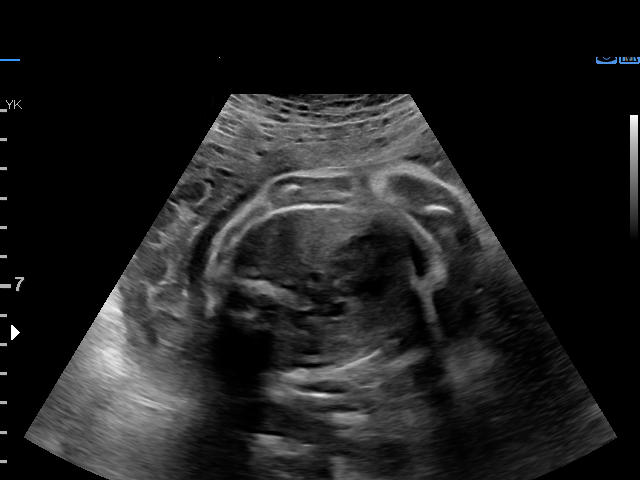
[im 44/63]
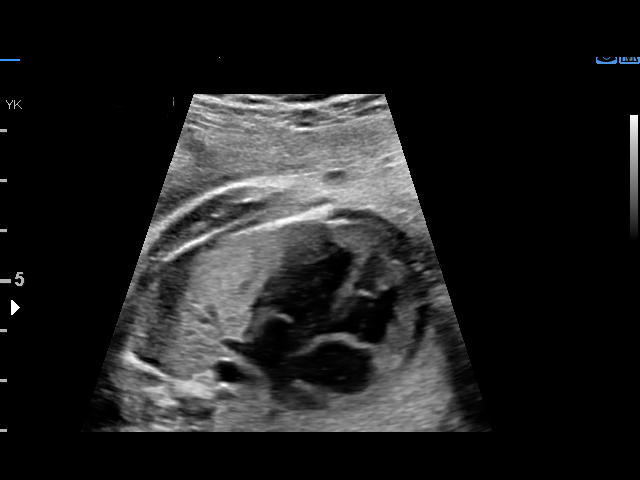
[im 49/63]
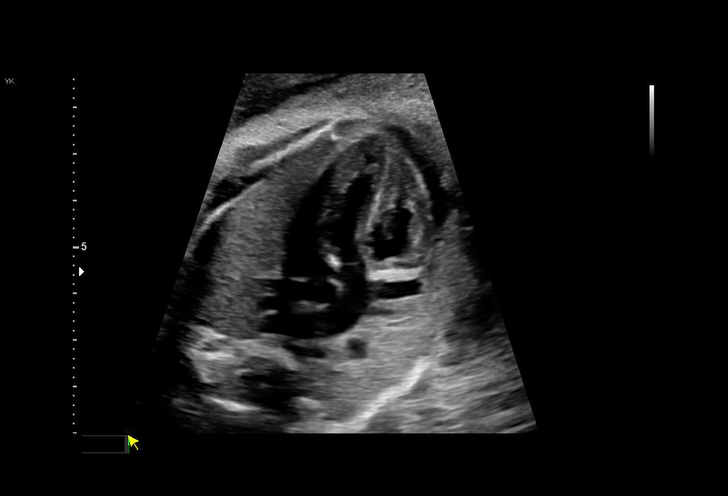
[im 53/63]
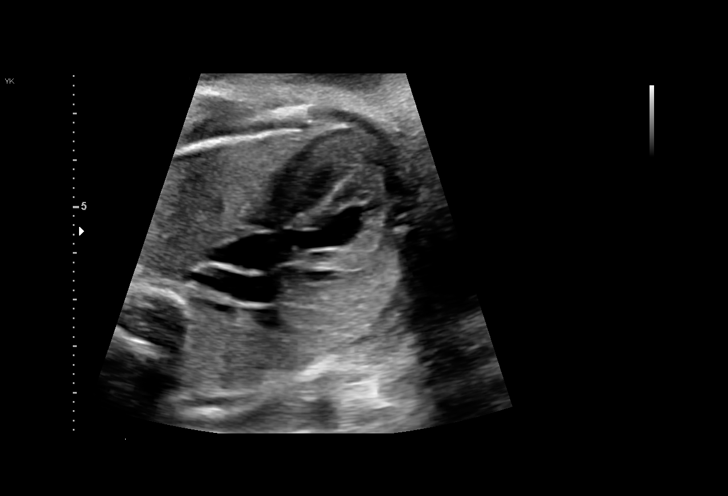
[im 58/63]
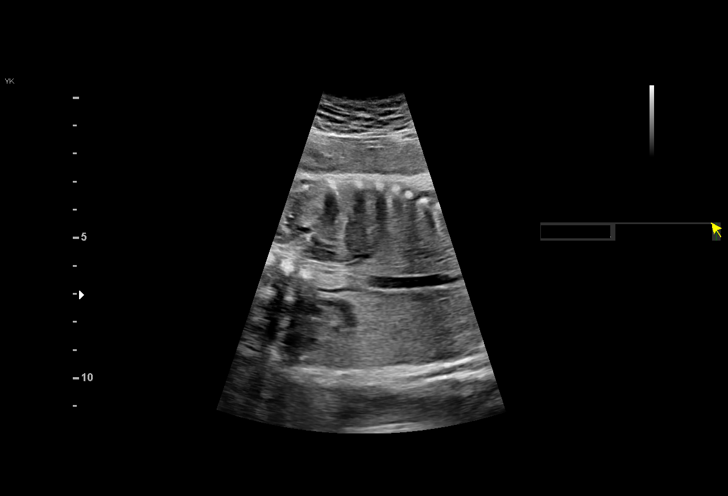
[im 63/63]
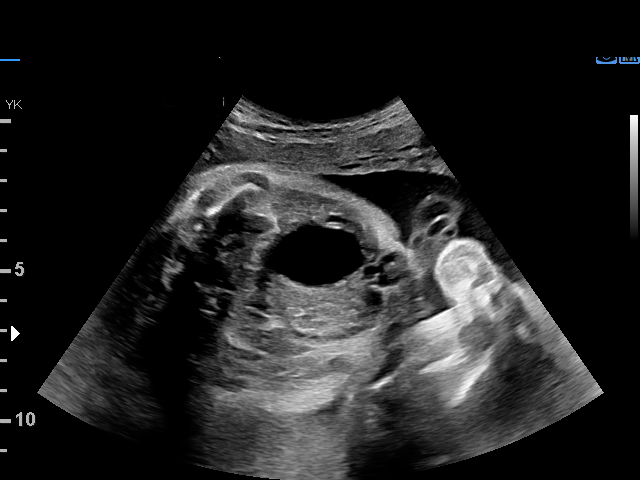

[14 of 28 positions shown; findings below may reference images not displayed]

Indications

 Obesity complicating pregnancy, second
 trimester (pregravid BMI 30.8)
 Pyelectasis of fetus on prenatal ultrasound
 Encounter for other antenatal screening
 follow-up
 32 weeks gestation of pregnancy
Fetal Evaluation

 Num Of Fetuses:         1
 Fetal Heart Rate(bpm):  150
 Cardiac Activity:       Observed
 Presentation:           Cephalic
 Placenta:               Left lateral
 P. Cord Insertion:      Visualized, central

 Amniotic Fluid
 AFI FV:      Within normal limits

 AFI Sum(cm)     %Tile       Largest Pocket(cm)
 12.09           33

 RUQ(cm)       RLQ(cm)       LUQ(cm)        LLQ(cm)

Biometry
 BPD:      78.8  mm     G. Age:  31w 4d         15  %    CI:        76.61   %    70 - 86
                                                         FL/HC:      20.4   %    19.9 -
 HC:      285.2  mm     G. Age:  31w 2d        1.7  %    HC/AC:      0.96        0.96 -
 AC:      295.8  mm     G. Age:  33w 4d         75  %    FL/BPD:     74.0   %    71 - 87
 FL:       58.3  mm     G. Age:  30w 3d        2.4  %    FL/AC:      19.7   %    20 - 24

 Est. FW:    9131  gm      4 lb 4 oz     27  %
OB History

 Gravidity:    2         Term:   1        Prem:   0        SAB:   0
 TOP:          0       Ectopic:  0        Living: 1
Gestational Age

 U/S Today:     31w 5d                                        EDD:   01/23/21
 Best:          32w 5d     Det. By:  Early Ultrasound         EDD:   01/16/21
                                     (05/24/20)
Anatomy

 Cranium:               Appears normal         LVOT:                   Appears normal
 Cavum:                 Appears normal         Aortic Arch:            Previously seen
 Ventricles:            Appears normal         Ductal Arch:            Previously seen
 Choroid Plexus:        Previously seen        Diaphragm:              Appears normal
 Cerebellum:            Previously seen        Stomach:                Appears normal, left
                                                                       sided
 Posterior Fossa:       Previously seen        Abdomen:                Appears normal
 Nuchal Fold:           Not applicable (>20    Abdominal Wall:         Appears nml (cord
                        wks GA)                                        insert, abd wall)
 Face:                  Orbits and profile     Cord Vessels:           Appears normal (3
                        previously seen                                vessel cord)
 Lips:                  Previously seen        Kidneys:                Appear normal
 Palate:                Previously seen        Bladder:                Appears normal
 Thoracic:              Appears normal         Spine:                  Previously seen
 Heart:                 Appears normal         Upper Extremities:      Previously seen
                        (4CH, axis, and
                        situs)
 RVOT:                  Previously seen        Lower Extremities:      Previously seen

 Other:  Fetus appears to be female. Heels/feet and open hands/5th digits
         prev visualized. Nasal bone prev visualized. VC, 3VV and 3VTV prev
         visualized.
Impression

 Patient returns for fetal growth assessment.  She does not
 have gestational diabetes.  On previous ultrasound short
 female length was seen.

 Amniotic fluid is normal and good fetal activity seen.  Fetal
 growth is appropriate for gestational age.  Femur length
 measurement is at less than the 5th percentile but interval
 growth is seen.  Both kidneys appear normal with no
 evidence of urinary tract dilations.  Mild pericardial effusion (5
 mm) is seen.  No pericardial effusion at the level of AV valve
 is seen.
 Pericardial effusions between 2 and 7 mm are not associated
 with any adverse outcomes.

 I called the patient left voicemail.
 This study was remotely read .
Recommendations

 -An appointment was made for her to return in 4 weeks for
 fetal growth assessment.
                 Naumann, Pratima

## 2021-05-30 ENCOUNTER — Ambulatory Visit (INDEPENDENT_AMBULATORY_CARE_PROVIDER_SITE_OTHER): Payer: BC Managed Care – PPO

## 2021-05-30 ENCOUNTER — Other Ambulatory Visit: Payer: Self-pay

## 2021-05-30 VITALS — BP 125/84 | HR 67 | Wt 144.1 lb

## 2021-05-30 DIAGNOSIS — Z3042 Encounter for surveillance of injectable contraceptive: Secondary | ICD-10-CM

## 2021-05-31 MED ORDER — MEDROXYPROGESTERONE ACETATE 150 MG/ML IM SUSP
150.0000 mg | Freq: Once | INTRAMUSCULAR | Status: AC
Start: 2021-05-31 — End: 2021-05-30
  Administered 2021-05-30: 150 mg via INTRAMUSCULAR

## 2021-05-31 NOTE — Progress Notes (Signed)
Deanna Patterson here for Depo-Provera Injection. Pt states she would like receive Depo Provera every 2 months. Per PP note on 03/20/21, this was discussed at [facility] appt and pt was to return for further discussion about contraception and PAP smear.   Reviewed with [name], MD who states pt may receive Depo Provera today as patient is only 6 days early for injection. [name] states she would not recommend patient return for next injection earlier than Depo Provera schedule recommends. Injection administered without complication. Patient will return in 3 months for next injection between 08/15/21 and 08/29/21.   Patient is due for PAP smear. Last PAP smear done 04/24/17 ASCUS with no HPV testing. Offered provider visit today to update PAP; pt declines. Explained purpose of PAP smear to screen for cervical cancer and importance of doing this regularly, especially following an abnormal PAP result. Pt states she would like to come back another time for this. Encouraged pt to schedule this appt.  [name], RN 05/30/21

## 2021-06-04 NOTE — Progress Notes (Signed)
Attestation of Attending Supervision of clinical support staff: I agree with the care provided to this patient and was available for any consultation.  I have reviewed the RN's note and chart. I was consulted about the admin of depo early. RN documentation reflect my recommendations. Additionally asked RN to stress importance of pap and even offered to perform today if patient amenable. Patient declined.   Federico Flake, MD, MPH, ABFM Attending Physician Faculty Practice- Center for [facility]

## 2021-08-22 ENCOUNTER — Ambulatory Visit: Payer: BC Managed Care – PPO

## 2021-10-11 ENCOUNTER — Emergency Department (HOSPITAL_COMMUNITY)
Admission: EM | Admit: 2021-10-11 | Discharge: 2021-10-11 | Disposition: A | Discharge status: Home / Self Care | Payer: 59 | Attending: Emergency Medicine | Admitting: Emergency Medicine

## 2021-10-11 ENCOUNTER — Other Ambulatory Visit: Payer: Self-pay

## 2021-10-11 DIAGNOSIS — U071 COVID-19: Secondary | ICD-10-CM | POA: Diagnosis not present

## 2021-10-11 DIAGNOSIS — Z5321 Procedure and treatment not carried out due to patient leaving prior to being seen by health care provider: Secondary | ICD-10-CM | POA: Diagnosis not present

## 2021-10-11 DIAGNOSIS — R110 Nausea: Secondary | ICD-10-CM | POA: Insufficient documentation

## 2021-10-11 DIAGNOSIS — J029 Acute pharyngitis, unspecified: Secondary | ICD-10-CM | POA: Diagnosis present

## 2021-10-11 LAB — CBC WITH DIFFERENTIAL/PLATELET
Abs Immature Granulocytes: 0.02 10*3/uL (ref 0.00–0.07)
Basophils Absolute: 0.1 10*3/uL (ref 0.0–0.1)
Basophils Relative: 1 %
Eosinophils Absolute: 0.2 10*3/uL (ref 0.0–0.5)
Eosinophils Relative: 2 %
HCT: 38.6 % (ref 36.0–46.0)
Hemoglobin: 12.8 g/dL (ref 12.0–15.0)
Immature Granulocytes: 0 %
Lymphocytes Relative: 4 %
Lymphs Abs: 0.4 10*3/uL — ABNORMAL LOW (ref 0.7–4.0)
MCH: 30.9 pg (ref 26.0–34.0)
MCHC: 33.2 g/dL (ref 30.0–36.0)
MCV: 93.2 fL (ref 80.0–100.0)
Monocytes Absolute: 1.1 10*3/uL — ABNORMAL HIGH (ref 0.1–1.0)
Monocytes Relative: 12 %
Neutro Abs: 7.3 10*3/uL (ref 1.7–7.7)
Neutrophils Relative %: 81 %
Platelets: 253 10*3/uL (ref 150–400)
RBC: 4.14 MIL/uL (ref 3.87–5.11)
RDW: 12.4 % (ref 11.5–15.5)
WBC: 9 10*3/uL (ref 4.0–10.5)
nRBC: 0 % (ref 0.0–0.2)

## 2021-10-11 LAB — RESP PANEL BY RT-PCR (FLU A&B, COVID) ARPGX2
Influenza A by PCR: NEGATIVE
Influenza B by PCR: NEGATIVE
SARS Coronavirus 2 by RT PCR: POSITIVE — AB

## 2021-10-11 MED ORDER — ACETAMINOPHEN 325 MG PO TABS
650.0000 mg | ORAL_TABLET | Freq: Once | ORAL | Status: AC
  Administered 2021-10-11: 650 mg via ORAL
  Filled 2021-10-11: qty 2

## 2021-10-11 NOTE — ED Notes (Signed)
Called pt no answer °

## 2021-10-11 NOTE — ED Triage Notes (Signed)
Pt. Stated, Deanna Patterson had a scratchy throat, chills, and nausea

## 2021-10-11 NOTE — ED Notes (Signed)
Pt called for vitals multiples time, no response. Other pts have said they seen this pt leave.

## 2021-11-19 ENCOUNTER — Other Ambulatory Visit: Payer: Self-pay

## 2021-11-19 ENCOUNTER — Ambulatory Visit (INDEPENDENT_AMBULATORY_CARE_PROVIDER_SITE_OTHER): Payer: 59

## 2021-11-19 VITALS — BP 127/74 | HR 83 | Wt 144.8 lb

## 2021-11-19 DIAGNOSIS — Z3201 Encounter for pregnancy test, result positive: Secondary | ICD-10-CM

## 2021-11-19 DIAGNOSIS — Z3200 Encounter for pregnancy test, result unknown: Secondary | ICD-10-CM

## 2021-11-19 DIAGNOSIS — Z3491 Encounter for supervision of normal pregnancy, unspecified, first trimester: Secondary | ICD-10-CM

## 2021-11-19 LAB — POCT PREGNANCY, URINE: Preg Test, Ur: POSITIVE — AB

## 2021-11-19 MED ORDER — PRENATAL PLUS 27-1 MG PO TABS: 1.0000 | ORAL_TABLET | Freq: Every day | ORAL | 11 refills | Status: DC

## 2021-11-19 NOTE — Patient Instructions (Signed)

## 2021-11-19 NOTE — Progress Notes (Signed)
Possible Pregnancy  Here today for pregnancy confirmation. UPT in office today is positive. Pt reports first positive home UPT on 11/14/21. Patient has not had a menstrual period since last Depo Provera injection on 05/30/21. Patient reports negative home UPT in November. Explained that we will need an ultrasound for dating purposes prior to scheduling new OB.  OB history reviewed; 1 full term vaginal delivery on 01/01/21. Reviewed medications and allergies with patient; list of medications safe to take during pregnancy given.  Prenatal vitamin sent to preferred pharmacy. Patient will contact office following [place] on 12/03/21 to schedule new OB. Patient states she may move to [place] and may want to transfer care to [facility] office.   [name], RN 11/19/2021  9:55 AM

## 2021-11-24 NOTE — L&D Delivery Note (Signed)
Delivery Note  [name] is a 26 y.o. female G3P2002 with IUP at [redacted]w[redacted]d admitted for IOL for A1GDM.  She progressed with/without augmentation to complete and pushed to deliver.   At 8:36 PM a healthy female was delivered via Vaginal, Spontaneous (Presentation: Left Occiput Anterior).  APGAR: 9, 9; weight  .   Placenta status: Spontaneous, Intact.  Cord: 3 vessels with the following complications: None.  Cord pH: not collected.  Cord clamping delayed by 1 minute then clamped by me and cut by FOB. Placenta intact and spontaneous, bleeding minimal. No lacerations noted. Mom and baby stable prior to transfer to postpartum. She plans on breastfeeding. She requests depo for birth control.  Anesthesia: Epidural Episiotomy: None Lacerations: None Suture Repair:  n/a Est. Blood Loss (mL): 102cc  Mom to postpartum.  Baby to Couplet care / Skin to Skin.  Makenzie Weisner J Glendia Olshefski 07/06/2022, 9:30 PM

## 2021-12-03 ENCOUNTER — Ambulatory Visit: Payer: 59

## 2021-12-04 ENCOUNTER — Ambulatory Visit
Admission: RE | Admit: 2021-12-04 | Discharge: 2021-12-04 | Disposition: A | Discharge status: Home / Self Care | Payer: 59 | Source: Ambulatory Visit | Attending: Family Medicine | Admitting: Family Medicine

## 2021-12-04 ENCOUNTER — Ambulatory Visit (INDEPENDENT_AMBULATORY_CARE_PROVIDER_SITE_OTHER): Payer: 59

## 2021-12-04 ENCOUNTER — Other Ambulatory Visit: Payer: Self-pay | Admitting: Family Medicine

## 2021-12-04 ENCOUNTER — Other Ambulatory Visit: Payer: Self-pay

## 2021-12-04 VITALS — BP 132/73 | HR 80 | Wt 147.0 lb

## 2021-12-04 DIAGNOSIS — O208 Other hemorrhage in early pregnancy: Secondary | ICD-10-CM | POA: Diagnosis not present

## 2021-12-04 DIAGNOSIS — Z3491 Encounter for supervision of normal pregnancy, unspecified, first trimester: Secondary | ICD-10-CM | POA: Diagnosis present

## 2021-12-04 DIAGNOSIS — Z3A08 8 weeks gestation of pregnancy: Secondary | ICD-10-CM | POA: Insufficient documentation

## 2021-12-04 MED ORDER — VITAFOL GUMMIES 3.33-0.333-34.8 MG PO CHEW
3.0000 | CHEWABLE_TABLET | Freq: Every day | ORAL | 10 refills | Status: DC
Start: 2021-12-04 — End: 2022-06-19

## 2021-12-04 NOTE — Progress Notes (Signed)
Patient seen today for ultrasound results review. Reviewed patient's medications and allergies with her. Patient stated she had an issue picking up the prenatal vitamin prescribed. I called patient's pharmacy and found out the prescription was not covered by the patient's insurance. A new prenatal vitamin ordered. Reviewed ultrasound results with patient and reviewed subchorionic hemorrhage and answered patients questions. Patient complained of nausea and vomiting and requested medication. Recommended vitamin B-6 and Unisom over the counter. Patient denies any other concerns or questions.   Alesia Richards, RN 12/04/21

## 2021-12-24 ENCOUNTER — Encounter: Payer: Self-pay | Admitting: Obstetrics and Gynecology

## 2021-12-24 ENCOUNTER — Other Ambulatory Visit: Payer: Self-pay

## 2021-12-24 ENCOUNTER — Ambulatory Visit (INDEPENDENT_AMBULATORY_CARE_PROVIDER_SITE_OTHER): Payer: 59 | Admitting: Obstetrics and Gynecology

## 2021-12-24 ENCOUNTER — Other Ambulatory Visit (HOSPITAL_COMMUNITY)
Admission: RE | Admit: 2021-12-24 | Discharge: 2021-12-24 | Disposition: A | Discharge status: Home / Self Care | Payer: 59 | Source: Ambulatory Visit | Attending: Obstetrics and Gynecology | Admitting: Obstetrics and Gynecology

## 2021-12-24 VITALS — BP 115/75 | HR 85 | Wt 148.0 lb

## 2021-12-24 DIAGNOSIS — R87610 Atypical squamous cells of undetermined significance on cytologic smear of cervix (ASC-US): Secondary | ICD-10-CM

## 2021-12-24 DIAGNOSIS — Z3480 Encounter for supervision of other normal pregnancy, unspecified trimester: Secondary | ICD-10-CM | POA: Diagnosis not present

## 2021-12-24 DIAGNOSIS — Z3A11 11 weeks gestation of pregnancy: Secondary | ICD-10-CM

## 2021-12-24 DIAGNOSIS — Z3491 Encounter for supervision of normal pregnancy, unspecified, first trimester: Secondary | ICD-10-CM | POA: Diagnosis present

## 2021-12-24 NOTE — Progress Notes (Signed)
Patient declined genetic screening

## 2021-12-25 ENCOUNTER — Encounter: Payer: Self-pay | Admitting: Obstetrics and Gynecology

## 2021-12-25 DIAGNOSIS — Z3480 Encounter for supervision of other normal pregnancy, unspecified trimester: Secondary | ICD-10-CM | POA: Insufficient documentation

## 2021-12-25 LAB — CBC/D/PLT+RPR+RH+ABO+RUBIGG...
Antibody Screen: NEGATIVE
Basophils Absolute: 0.1 10*3/uL (ref 0.0–0.2)
Basos: 1 %
EOS (ABSOLUTE): 0.3 10*3/uL (ref 0.0–0.4)
Eos: 5 %
HCV Ab: <0.1 s/co ratio (ref 0.0–0.9)
HIV Screen 4th Generation wRfx: NONREACTIVE
Hematocrit: 35.3 % (ref 34.0–46.6)
Hemoglobin: 12 g/dL (ref 11.1–15.9)
Hepatitis B Surface Ag: NEGATIVE
Immature Grans (Abs): 0 10*3/uL (ref 0.0–0.1)
Immature Granulocytes: 0 %
Lymphocytes Absolute: 1.8 10*3/uL (ref 0.7–3.1)
Lymphs: 27 %
MCH: 31.1 pg (ref 26.6–33.0)
MCHC: 34 g/dL (ref 31.5–35.7)
MCV: 92 fL (ref 79–97)
Monocytes Absolute: 0.6 10*3/uL (ref 0.1–0.9)
Monocytes: 9 %
Neutrophils Absolute: 3.9 10*3/uL (ref 1.4–7.0)
Neutrophils: 58 %
Platelets: 302 10*3/uL (ref 150–450)
RBC: 3.86 x10E6/uL (ref 3.77–5.28)
RDW: 12.5 % (ref 11.7–15.4)
RPR Ser Ql: NONREACTIVE
Rh Factor: POSITIVE
Rubella Antibodies, IGG: 1.25 index (ref >0.99)
WBC: 6.7 10*3/uL (ref 3.4–10.8)

## 2021-12-25 LAB — HCV INTERPRETATION

## 2021-12-25 LAB — HEMOGLOBIN A1C
Est. average glucose Bld gHb Est-mCnc: 105 mg/dL
Hgb A1c MFr Bld: 5.3 % (ref 4.8–5.6)

## 2021-12-25 LAB — CYTOLOGY - PAP
Chlamydia: NEGATIVE
Comment: NEGATIVE
Comment: NORMAL
Diagnosis: NEGATIVE
Neisseria Gonorrhea: NEGATIVE

## 2021-12-25 NOTE — Progress Notes (Signed)
New OB Note  12/24/2021   Clinic: Center for [facility]  Chief Complaint: new OB  Transfer of Care Patient: no  History of Present Illness: [name] is a 26 y.o. [id number] @ 11/3 weeks (EDC 8/19, based on 8wk u/s). Patient does not know her LMP  Preg complicated by has Other fatigue; Intractable migraine with aura without status migrainosus; Vitamin D deficiency; Asthma; and Atypical squamous cells of undetermined significance (ASCUS) on Papanicolaou smear of cervix on their problem list.   Any events prior to today's visit: no She was using no method when she conceived.  She has Negative signs or symptoms of nausea/vomiting of pregnancy. She has Negative signs or symptoms of miscarriage or preterm labor  ROS: A 12-point review of systems was performed and negative, except as stated in the above HPI.  OBGYN History: As per HPI. OB History  Gravida Para Term Preterm AB Living  3 2 2     2   SAB IAB Ectopic Multiple Live Births          2    # Outcome Date GA Lbr Len/2nd Weight Sex Delivery Anes PTL Lv  3 Current           2 Term 01/02/20 [redacted]w[redacted]d  5 lb 13.7 oz (2.656 kg) F Vag-Spont  Y LIV  1 Term 07/01/14 [redacted]w[redacted]d  5 lb 15 oz (2.693 kg) F Vag-Spont EPI  LIV     Birth Comments: IOL because "FHR dropping"    Any issues with any prior pregnancies: no Prior children are healthy, doing well, and without any problems or issues: yes   Past Medical History: Past Medical History:  Diagnosis Date   Asthma    GBS bacteriuria 05/30/2020   +GBS in urine 05/24/20  PCN allergy    Migraines     Past Surgical History: Past Surgical History:  Procedure Laterality Date   NO PAST SURGERIES      Family History:  Family History  Problem Relation Age of Onset   Hyperlipidemia Mother    Hypertension Mother    Thyroid disease Mother    Hyperlipidemia Maternal Grandmother    Hypertension Maternal Grandmother     Social History:  Social History   Socioeconomic  History   Marital status: Married    Spouse name: Not on file   Number of children: Not on file   Years of education: Not on file   Highest education level: Not on file  Occupational History   Not on file  Tobacco Use   Smoking status: Never   Smokeless tobacco: Never  Vaping Use   Vaping Use: Never used  Substance and Sexual Activity   Alcohol use: Not Currently    Alcohol/week: 0.0 standard drinks    Comment: wine daily until pregnancy   Drug use: No   Sexual activity: Yes    Birth control/protection: None  Other Topics Concern   Not on file  Social History Narrative   Not on file   Social Determinants of Health   Financial Resource Strain: Not on file  Food Insecurity: No Food Insecurity   Worried About Running Out of Food in the Last Year: Never true   Ran Out of Food in the Last Year: Never true  Transportation Needs: No Transportation Needs   Lack of Transportation (Medical): No   Lack of Transportation (Non-Medical): No  Physical Activity: Not on file  Stress: Not on file  Social Connections: Not on file  Intimate  Partner Violence: Not on file    Allergy: Allergies  Allergen Reactions   Peanut-Containing Drug Products Rash, Shortness Of Breath and Swelling   Eggs Or Egg-Derived Products Swelling    Swelling of the lips, tongue and throat.   Peanuts [Peanut Oil] Swelling    Swelling of the lips, tongue and throat.   Penicillins Rash    Current Outpatient Medications: Prenatal vitamin  Physical Exam:   BP 115/75    Pulse 85    Wt 148 lb (67.1 kg)    BMI 28.90 kg/m  Body mass index is 28.9 kg/m. Contractions: Not present Vag. Bleeding: None. Fundal height: not applicable FHTs: 160s  General appearance: Well nourished, well developed female in no acute distress.  Neck:  Supple, normal appearance, and no thyromegaly  Cardiovascular: S1, S2 normal, no murmur, rub or gallop, regular rate and rhythm Respiratory:  Clear to auscultation bilateral.  Normal respiratory effort Abdomen: positive bowel sounds and no masses, hernias; diffusely non tender to palpation, non distended Breasts: patient denies any breast s/s. Neuro/Psych:  Normal mood and affect.  Skin:  Warm and dry.  Lymphatic:  No inguinal lymphadenopathy.   Pelvic exam: is not limited by body habitus EGBUS: within normal limits, Vagina: within normal limits and with no blood in the vault, Cervix: normal appearing cervix without discharge or lesions, closed/long/high, Uterus:  enlarged, c/w 12-14 week size, and Adnexa:  normal adnexa and no mass, fullness, tenderness  Laboratory: none  Imaging:  Narrative & Impression  CLINICAL DATA:  Pregnancy with uncertain dates in first trimester. Dating and viability. Unsure LMP.   EXAM: OBSTETRIC <14 WK ULTRASOUND   TECHNIQUE: Transabdominal ultrasound was performed for evaluation of the gestation as well as the maternal uterus and adnexal regions. Patient declined transvaginal evaluation.   COMPARISON:  None this pregnancy   FINDINGS: Intrauterine gestational sac: Single   Yolk sac:  Visualized.   Embryo:  Visualized.   Cardiac Activity: Visualized.   Heart Rate: 164 bpm   CRL:   20 mm   8 w 4 d                  [place] EDC: 07/12/2022   Subchorionic hemorrhage:  Small measuring 1.5 x 0.6 cm   Maternal uterus/adnexae: Single live intrauterine pregnancy. Small subchorionic hemorrhage. Both ovaries are visualized and are normal. No adnexal mass or pelvic free fluid.   IMPRESSION: 1. Single live intrauterine pregnancy estimated gestational age [redacted] weeks 4 days based on crown-rump length for ultrasound [facility] 07/12/2022. 2. Small subchorionic hemorrhage.     Electronically Signed   By: Narda Rutherford M.D.   On: 12/04/2021 15:27    Assessment: patient doing well  Plan: 1. [redacted] weeks gestation of pregnancy Routine care. Offer afp nv. Pt declines genetics - CBC/D/Plt+RPR+Rh+ABO+RubIgG... - Hemoglobin A1c -  Cytology - PAP( [place]) - Culture, OB Urine - [place] MFM OB COMP + 14 WK; Future  2. Atypical squamous cells of undetermined significance (ASCUS) on Papanicolaou smear of cervix Per chart she had 03/2017 ascus pap and plan to repeat in one year.   Problem list reviewed and updated.  Follow up in 4 weeks.  >50% of 35 min visit spent on counseling and coordination of care.     Cornelia Copa MD Attending Center for [facility])

## 2021-12-26 LAB — CULTURE, OB URINE

## 2021-12-26 LAB — URINE CULTURE, OB REFLEX: Organism ID, Bacteria: NO GROWTH

## 2021-12-30 ENCOUNTER — Encounter: Payer: Self-pay | Admitting: *Deleted

## 2022-02-17 ENCOUNTER — Other Ambulatory Visit: Payer: Self-pay

## 2022-02-17 ENCOUNTER — Ambulatory Visit: Payer: 59 | Attending: Obstetrics and Gynecology

## 2022-02-17 DIAGNOSIS — Z3492 Encounter for supervision of normal pregnancy, unspecified, second trimester: Secondary | ICD-10-CM | POA: Insufficient documentation

## 2022-02-17 DIAGNOSIS — Z3689 Encounter for other specified antenatal screening: Secondary | ICD-10-CM | POA: Insufficient documentation

## 2022-02-17 DIAGNOSIS — Z3A11 11 weeks gestation of pregnancy: Secondary | ICD-10-CM

## 2022-02-17 DIAGNOSIS — Z3A19 19 weeks gestation of pregnancy: Secondary | ICD-10-CM | POA: Insufficient documentation

## 2022-02-17 DIAGNOSIS — Z363 Encounter for antenatal screening for malformations: Secondary | ICD-10-CM | POA: Insufficient documentation

## 2022-02-19 ENCOUNTER — Telehealth (INDEPENDENT_AMBULATORY_CARE_PROVIDER_SITE_OTHER): Payer: 59 | Admitting: Medical

## 2022-02-19 ENCOUNTER — Encounter: Payer: Self-pay | Admitting: Medical

## 2022-02-19 DIAGNOSIS — M549 Dorsalgia, unspecified: Secondary | ICD-10-CM

## 2022-02-19 DIAGNOSIS — Z3480 Encounter for supervision of other normal pregnancy, unspecified trimester: Secondary | ICD-10-CM

## 2022-02-19 DIAGNOSIS — O99891 Other specified diseases and conditions complicating pregnancy: Secondary | ICD-10-CM

## 2022-02-19 DIAGNOSIS — Z3A19 19 weeks gestation of pregnancy: Secondary | ICD-10-CM

## 2022-02-19 NOTE — Progress Notes (Signed)
I connected with Deanna Patterson 02/19/22 at  2:15 PM EDT by: MyChart video and verified that I am speaking with the correct person using two identifiers.  Patient is located at work and provider is located at [employer].   ?  ?The purpose of this virtual visit is to provide medical care while limiting exposure to the novel coronavirus. I discussed the limitations, risks, security and privacy concerns of performing an evaluation and management service by MyChart video and the availability of in person appointments. I also discussed with the patient that there may be a patient responsible charge related to this service. By engaging in this virtual visit, you consent to the provision of healthcare.  Additionally, you authorize for your insurance to be billed for the services provided during this visit.  The patient expressed understanding and agreed to proceed. ? ?The following staff members participated in the virtual visit:  Paulina Fusi, RN ? ? ? ?PRENATAL VISIT NOTE ? ?Subjective:  ?Deanna Patterson is a 26 y.o. G3P2002 at [redacted]w[redacted]d  for phone visit for ongoing prenatal care.  She is currently monitored for the following issues for this low-risk pregnancy and has Other fatigue; Intractable migraine with aura without status migrainosus; Vitamin D deficiency; Asthma; Atypical squamous cells of undetermined significance (ASCUS) on Papanicolaou smear of cervix; and Supervision of other normal pregnancy, antepartum on their problem list. ? ?Patient reports backache.  Contractions: Not present. Vag. Bleeding: None.  Movement: Present. Denies leaking of fluid.  ? ?The following portions of the patient's history were reviewed and updated as appropriate: allergies, current medications, past family history, past medical history, past social history, past surgical history and problem list.  ? ?Objective:  ?There were no vitals filed for this visit. Self-Obtained ? ?Fetal Status:     Movement: Present    ? ?Assessment  and Plan:  ?Pregnancy: [id number] at [redacted]w[redacted]d ?1. Supervision of other normal pregnancy, antepartum ?- Doing well ?- Normal anatomy US reviewed  ?- Planning to breastfeed ?Bennye Alm on [facility] ?- Uses [facility] [address] for Peds ?- Does not have BP cuff with her today, reports normal weekly BP at home  ? ?2. Back pain affecting pregnancy in second trimester ?- Discussed abdominal binder  ?- Also discussed Tylenol and hydrotherapy for pain PRN  ? ?3. [redacted] weeks gestation of pregnancy ? ?Preterm labor symptoms and general obstetric precautions including but not limited to vaginal bleeding, contractions, leaking of fluid and fetal movement were reviewed in detail with the patient. ? ?Return in about 4 weeks (around 03/19/2022) for LOB, Virtual, any provider. ? ?No future appointments. ? ? ?Time spent on virtual visit: 10 minutes ? ?Kerry Hough, PA-C ? ?

## 2022-03-26 NOTE — Progress Notes (Signed)
I connected with Deanna Patterson 03/27/22 at  2:15 PM EDT by: MyChart video and verified that I am speaking with the correct person using two identifiers.  Patient is located at home and provider is located at [facility].   ?  ?I discussed the limitations, risks, security and privacy concerns of performing an evaluation and management service by MyChart video and the availability of in person appointments. I also discussed with the patient that there may be a patient responsible charge related to this service. By engaging in this virtual visit, you consent to the provision of healthcare.  Additionally, you authorize for your insurance to be billed for the services provided during this visit.  The patient expressed understanding and agreed to proceed. ? ?The following staff members participated in the virtual visit:  Corinda Gubler ? ? ? ?PRENATAL VISIT NOTE ? ?Subjective:  ?Deanna Patterson is a 26 y.o. G3P2002 at [redacted]w[redacted]d  for virtual visit for ongoing prenatal care.  She is currently monitored for the following issues for this low-risk pregnancy and has Other fatigue; Intractable migraine with aura without status migrainosus; Vitamin D deficiency; Asthma; Atypical squamous cells of undetermined significance (ASCUS) on Papanicolaou smear of cervix; and Supervision of other normal pregnancy, antepartum on their problem list. ? ?Patient reports no complaints.  Contractions: Not present. Vag. Bleeding: None.  Movement: Present. Denies leaking of fluid. She reports strong fetal movements.  ? ?The following portions of the patient's history were reviewed and updated as appropriate: allergies, current medications, past family history, past medical history, past social history, past surgical history and problem list.  ? ?Objective:  ? ?Vitals:  ? 03/27/22 1424  ?BP: (!) 118/58  ?Pulse: 92  ? Self-Obtained ? ?Fetal Status:     Movement: Present    ? ?Assessment and Plan:  ?Pregnancy: [id number] at [redacted]w[redacted]d ? ?1. [redacted] weeks gestation of  pregnancy   ?2. Supervision of other normal pregnancy, antepartum   ?-patient would like to keep on Depo ?-patient given instructions on the next visit for two hour test, reviewed fasting, etc.  ? ?Preterm labor symptoms and general obstetric precautions including but not limited to vaginal bleeding, contractions, leaking of fluid and fetal movement were reviewed in detail with the patient. ? ?Return in about 4 weeks (around 04/24/2022), or LROB and 2 hour GTT. ? ?No future appointments. ? ? ? ?Time spent on virtual visit: 20 minutes ? ?Marylene Land, CNM ? ?

## 2022-03-27 ENCOUNTER — Telehealth (INDEPENDENT_AMBULATORY_CARE_PROVIDER_SITE_OTHER): Payer: 59 | Admitting: Student

## 2022-03-27 VITALS — BP 118/58 | HR 92

## 2022-03-27 DIAGNOSIS — Z3A24 24 weeks gestation of pregnancy: Secondary | ICD-10-CM

## 2022-03-27 DIAGNOSIS — Z3480 Encounter for supervision of other normal pregnancy, unspecified trimester: Secondary | ICD-10-CM

## 2022-03-27 DIAGNOSIS — Z3482 Encounter for supervision of other normal pregnancy, second trimester: Secondary | ICD-10-CM

## 2022-05-07 ENCOUNTER — Other Ambulatory Visit: Payer: Self-pay | Admitting: *Deleted

## 2022-05-07 ENCOUNTER — Ambulatory Visit (INDEPENDENT_AMBULATORY_CARE_PROVIDER_SITE_OTHER): Payer: 59 | Admitting: Certified Nurse Midwife

## 2022-05-07 ENCOUNTER — Other Ambulatory Visit: Payer: Self-pay

## 2022-05-07 VITALS — BP 117/63 | HR 81 | Wt 160.4 lb

## 2022-05-07 DIAGNOSIS — Z3493 Encounter for supervision of normal pregnancy, unspecified, third trimester: Secondary | ICD-10-CM

## 2022-05-07 DIAGNOSIS — Z23 Encounter for immunization: Secondary | ICD-10-CM

## 2022-05-07 DIAGNOSIS — O99891 Other specified diseases and conditions complicating pregnancy: Secondary | ICD-10-CM

## 2022-05-07 DIAGNOSIS — Z3A30 30 weeks gestation of pregnancy: Secondary | ICD-10-CM

## 2022-05-07 DIAGNOSIS — M549 Dorsalgia, unspecified: Secondary | ICD-10-CM

## 2022-05-07 NOTE — Progress Notes (Signed)
   PRENATAL VISIT NOTE  Subjective:  Deanna Patterson is a 26 y.o. G3P2002 at [redacted]w[redacted]d being seen today for ongoing prenatal care.  She is currently monitored for the following issues for this low-risk pregnancy and has Other fatigue; Intractable migraine with aura without status migrainosus; Vitamin D deficiency; Asthma; Atypical squamous cells of undetermined significance (ASCUS) on Papanicolaou smear of cervix; and Supervision of other normal pregnancy, antepartum on their problem list.  Patient reports backache.  Contractions: Not present. Vag. Bleeding: None.  Movement: Present. Denies leaking of fluid.   The following portions of the patient's history were reviewed and updated as appropriate: allergies, current medications, past family history, past medical history, past social history, past surgical history and problem list.   Objective:   Vitals:   05/07/22 1335  BP: 117/63  Pulse: 81  Weight: 160 lb 6.4 oz (72.8 kg)    Fetal Status: Fetal Heart Rate (bpm): 139   Movement: Present     General:  Alert, oriented and cooperative. Patient is in no acute distress.  Skin: Skin is warm and dry. No rash noted.   Cardiovascular: Normal heart rate noted  Respiratory: Normal respiratory effort, no problems with respiration noted  Abdomen: Soft, gravid, appropriate for gestational age.  Pain/Pressure: Present     Pelvic: Cervical exam deferred        Extremities: Normal range of motion.  Edema: None  Mental Status: Normal mood and affect. Normal behavior. Normal judgment and thought content.   Assessment and Plan:  Pregnancy: G3P2002 at [redacted]w[redacted]d 1. Encounter for supervision of low-risk pregnancy in third trimester - Doing well, feeling regular and vigorous fetal movement  - Tdap vaccine greater than or equal to 7yo IM  2. [redacted] weeks gestation of pregnancy - Routine OB care   3. Back pain affecting pregnancy in third trimester - Reviewed stretches of upper back to relieve pain,  recommended back rubs, warm baths and Tylenol  Preterm labor symptoms and general obstetric precautions including but not limited to vaginal bleeding, contractions, leaking of fluid and fetal movement were reviewed in detail with the patient. Please refer to After Visit Summary for other counseling recommendations.   Return in about 2 weeks (around 05/21/2022) for IN-PERSON, LOB.  Future Appointments  Date Time Provider Department Center  05/08/2022  8:50 AM [facility]  05/21/2022  2:35 PM Bernerd Limbo, CNM [facility]  06/05/2022  1:15 PM Marylene Land, CNM [facility]  06/19/2022  2:35 PM Marylene Land, CNM [facility]  06/26/2022 10:55 AM Reva Bores, MD [facility]  07/03/2022  2:15 PM Marylene Land, CNM [facility]  07/10/2022 11:15 AM Berle Mull [facility]  07/17/2022  1:15 PM [facility] NST [facility]  07/17/2022  2:15 PM Crisoforo Oxford, Charlesetta Garibaldi, CNM [facility]    Bernerd Limbo, CNM

## 2022-05-08 ENCOUNTER — Other Ambulatory Visit: Payer: 59

## 2022-05-08 DIAGNOSIS — O0990 Supervision of high risk pregnancy, unspecified, unspecified trimester: Secondary | ICD-10-CM

## 2022-05-08 DIAGNOSIS — O24410 Gestational diabetes mellitus in pregnancy, diet controlled: Secondary | ICD-10-CM

## 2022-05-09 LAB — CBC
Hematocrit: 35 % (ref 34.0–46.6)
Hemoglobin: 11.9 g/dL (ref 11.1–15.9)
MCH: 31.9 pg (ref 26.6–33.0)
MCHC: 34 g/dL (ref 31.5–35.7)
MCV: 94 fL (ref 79–97)
Platelets: 249 10*3/uL (ref 150–450)
RBC: 3.73 x10E6/uL — ABNORMAL LOW (ref 3.77–5.28)
RDW: 12.3 % (ref 11.7–15.4)
WBC: 5.4 10*3/uL (ref 3.4–10.8)

## 2022-05-09 LAB — GLUCOSE TOLERANCE, 2 HOURS W/ 1HR
Glucose, 1 hour: 106 mg/dL (ref 70–179)
Glucose, 2 hour: 75 mg/dL (ref 70–152)
Glucose, Fasting: 108 mg/dL — ABNORMAL HIGH (ref 70–91)

## 2022-05-09 LAB — RPR: RPR Ser Ql: NONREACTIVE

## 2022-05-09 LAB — HIV ANTIBODY (ROUTINE TESTING W REFLEX): HIV Screen 4th Generation wRfx: NONREACTIVE

## 2022-05-10 ENCOUNTER — Encounter: Payer: Self-pay | Admitting: Certified Nurse Midwife

## 2022-05-10 MED ORDER — BLOOD GLUCOSE MONITOR KIT: PACK | 0 refills | Status: AC

## 2022-05-12 ENCOUNTER — Encounter: Payer: Self-pay | Admitting: *Deleted

## 2022-05-12 ENCOUNTER — Telehealth: Payer: Self-pay | Admitting: *Deleted

## 2022-05-12 NOTE — Telephone Encounter (Signed)
-----   Message from Bernerd Limbo, [place] sent at 05/10/2022  6:28 PM EDT ----- Pt has failed GTT, please call to get them scheduled with Marylene Land. I have put in orders for testing supplies and education. Thank you! Jam

## 2022-05-12 NOTE — Telephone Encounter (Signed)
I called Deanna Patterson to notify GTT results and appointment and I reached her voicemail. I left a message I was calling re: results and appointment, please review MyChart message. MyChart message sent with education appointment for tomorrow at 2:15.  [name]

## 2022-05-13 ENCOUNTER — Other Ambulatory Visit: Payer: Self-pay

## 2022-05-13 ENCOUNTER — Encounter: Payer: 59 | Attending: Certified Nurse Midwife | Admitting: Registered"

## 2022-05-13 ENCOUNTER — Ambulatory Visit (INDEPENDENT_AMBULATORY_CARE_PROVIDER_SITE_OTHER): Payer: 59 | Admitting: Registered"

## 2022-05-13 DIAGNOSIS — O24419 Gestational diabetes mellitus in pregnancy, unspecified control: Secondary | ICD-10-CM | POA: Insufficient documentation

## 2022-05-13 DIAGNOSIS — Z713 Dietary counseling and surveillance: Secondary | ICD-10-CM | POA: Insufficient documentation

## 2022-05-13 DIAGNOSIS — O24410 Gestational diabetes mellitus in pregnancy, diet controlled: Secondary | ICD-10-CM | POA: Insufficient documentation

## 2022-05-13 NOTE — Progress Notes (Unsigned)
Patient was seen for Gestational Diabetes self-management on 05/13/22  Start time 1412 and End time 1508   Estimated due date: 07/12/22; [redacted]w[redacted]d  Clinical: Medications: prenatal vitamins Medical History: reviewed Labs: OGTT FBS 108(H), A1c 5.3% 12/24/21  Dietary and Lifestyle History: Pt states she picked up her meter but left at home and has not used it yet. Pt reports her mother has diabetes and will show her how to check her blood sugar.  Pt states she continues to have nausea, reports vomiting 3-4x/week.  Diet: patient states she eats a lot of chicken, some vegetables, not a lot of dairy: cheese on sandwiches, babybell. Pt states she can eat more yogurt. Pt reports she has been drinking a lot of juice.   Physical Activity: not assessed Stress: not assessed Sleep: "plenty of sleep"  24 hr Recall:  First Meal: eggplant, 16 oz cranberry juice Snack: Second meal: ham sandwich (white bread) Snack: Third meal: Sausage, bun, chicken wings Snack: popcorn Beverages:  NUTRITION INTERVENTION  Nutrition education (E-1) on the following topics:   Initial Follow-up  [x]  []  Definition of Gestational Diabetes [x]  []  Why dietary management is important in controlling blood glucose [x]  []  Effects each nutrient has on blood glucose levels [x]  []  Simple carbohydrates vs complex carbohydrates [x]  []  Fluid intake [x]  []  Creating a balanced meal plan [x]  []  Carbohydrate counting  [x]  []  When to check blood glucose levels [x]  []  Proper blood glucose monitoring techniques [x]  []  Effect of stress and stress reduction techniques  [x]  []  Exercise effect on blood glucose levels, appropriate exercise during pregnancy [x]  []  Importance of limiting caffeine and abstaining from alcohol and smoking [x]  []  Medications used for blood sugar control during pregnancy [x]  []  Hypoglycemia and rule of 15 [x]  []  Postpartum self care  Patient already has a meter, is not testing pre breakfast and 2 hours  after each meal.  Patient instructed to monitor glucose levels: FBS: 60 - ? 95 mg/dL (some clinics use 90 for cutoff) 1 hour: ? 140 mg/dL 2 hour: ? mg/dL  Patient received handouts: Nutrition Diabetes and Pregnancy Carbohydrate Counting List  Patient will be seen for follow-up as needed.

## 2022-05-21 ENCOUNTER — Ambulatory Visit (INDEPENDENT_AMBULATORY_CARE_PROVIDER_SITE_OTHER): Payer: 59 | Admitting: Certified Nurse Midwife

## 2022-05-21 ENCOUNTER — Other Ambulatory Visit: Payer: Self-pay

## 2022-05-21 VITALS — BP 111/72 | HR 93 | Wt 164.8 lb

## 2022-05-21 DIAGNOSIS — Z3493 Encounter for supervision of normal pregnancy, unspecified, third trimester: Secondary | ICD-10-CM

## 2022-05-21 DIAGNOSIS — O24410 Gestational diabetes mellitus in pregnancy, diet controlled: Secondary | ICD-10-CM

## 2022-05-21 DIAGNOSIS — Z3A32 32 weeks gestation of pregnancy: Secondary | ICD-10-CM

## 2022-05-21 NOTE — Progress Notes (Signed)
   PRENATAL VISIT NOTE  Subjective:  Deanna Patterson is a 26 y.o. G3P2002 at [redacted]w[redacted]d being seen today for ongoing prenatal care.  She is currently monitored for the following issues for this low-risk pregnancy and has Other fatigue; Intractable migraine with aura without status migrainosus; Vitamin D deficiency; Asthma; Atypical squamous cells of undetermined significance (ASCUS) on Papanicolaou smear of cervix; Supervision of other normal pregnancy, antepartum; and Gestational diabetes mellitus (GDM), antepartum on their problem list.  Patient reports no complaints, getting used to GDM diet modifications and testing. Has started eating more often with more protein.  Contractions: Not present. Vag. Bleeding: None.  Movement: Present. Denies leaking of fluid.   The following portions of the patient's history were reviewed and updated as appropriate: allergies, current medications, past family history, past medical history, past social history, past surgical history and problem list.   Objective:   Vitals:   05/21/22 1458  BP: 111/72  Pulse: 93  Weight: 164 lb 12.8 oz (74.8 kg)    Fetal Status: Fetal Heart Rate (bpm): 147 Fundal Height: 32 cm Movement: Present  Presentation: Vertex  General:  Alert, oriented and cooperative. Patient is in no acute distress.  Skin: Skin is warm and dry. No rash noted.   Cardiovascular: Normal heart rate noted  Respiratory: Normal respiratory effort, no problems with respiration noted  Abdomen: Soft, gravid, appropriate for gestational age.  Pain/Pressure: Present     Pelvic: Cervical exam deferred        Extremities: Normal range of motion.  Edema: None  Mental Status: Normal mood and affect. Normal behavior. Normal judgment and thought content.   Assessment and Plan:  Pregnancy: G3P2002 at [redacted]w[redacted]d 1. Encounter for supervision of low-risk pregnancy in third trimester - Doing well, feeling regular and vigorous fetal movement   2. [redacted] weeks gestation  of pregnancy - Routine OB care   3. Diet controlled gestational diabetes mellitus (GDM), antepartum - Fastings well within range, post-prandials pt remembers all being <115-120.  Preterm labor symptoms and general obstetric precautions including but not limited to vaginal bleeding, contractions, leaking of fluid and fetal movement were reviewed in detail with the patient. Please refer to After Visit Summary for other counseling recommendations.   Return in about 2 weeks (around 06/04/2022) for VIRTUAL, LOB.  Future Appointments  Date Time Provider Department Center  06/05/2022  1:15 PM Madlyn Frankel [facility]  06/19/2022  2:35 PM Marylene Land, CNM [facility]  06/26/2022 10:55 AM Reva Bores, MD [facility]  07/03/2022  2:15 PM Marylene Land, CNM [facility]  07/10/2022 11:15 AM Berle Mull [facility]  07/17/2022  1:15 PM [facility] NST [facility]  07/17/2022  2:15 PM Crisoforo Oxford, Charlesetta Garibaldi, CNM [facility]   Bernerd Limbo, CNM

## 2022-05-21 NOTE — Progress Notes (Signed)
Did not bring glucose log to visit today. Reports BG 65-75 fasting. Cannot recall 2 HR post-meal numbers. Will send picture via MyChart.

## 2022-06-01 IMAGING — US US OB COMP LESS 14 WK
2 series · 15 of 28 positions shown · non-contrast
Comparison: None this pregnancy

CLINICAL DATA: Pregnancy with uncertain dates in first trimester.
Dating and viability. Unsure LMP.

EXAM:
OBSTETRIC <14 WK ULTRASOUND
TECHNIQUE: Transabdominal ultrasound was performed for evaluation of the
gestation as well as the maternal uterus and adnexal regions.
Patient declined transvaginal evaluation.

[Series 1: us ob comp less 14 wk · 14 of 51 slices shown (1 of 2)]
[im 1/51]
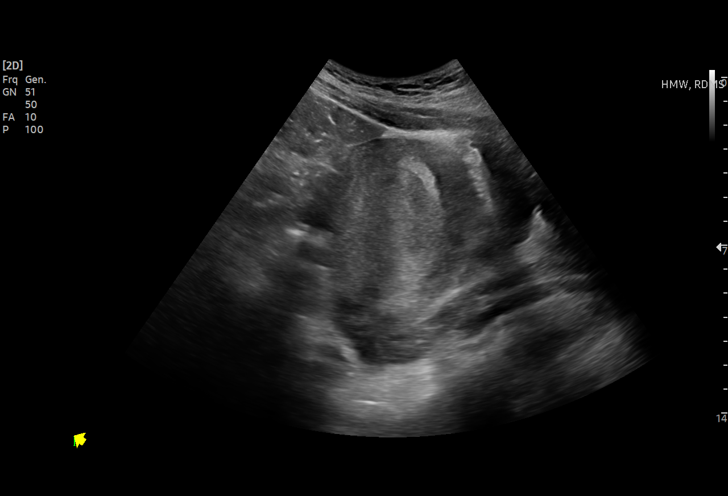
[im 4/51]
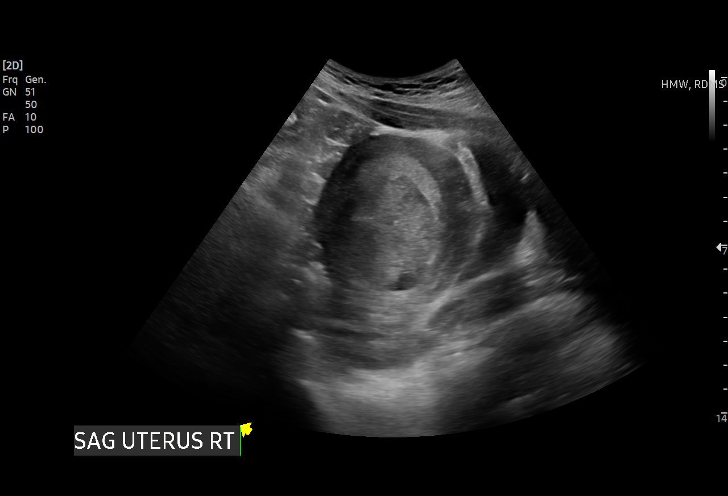
[im 8/51]
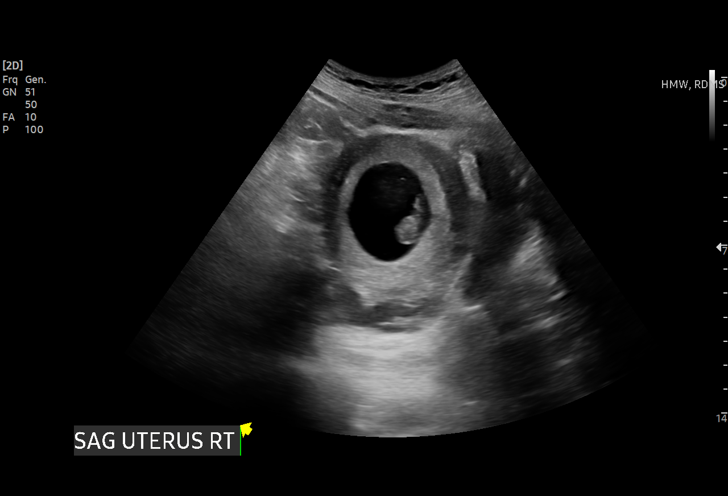
[im 12/51]
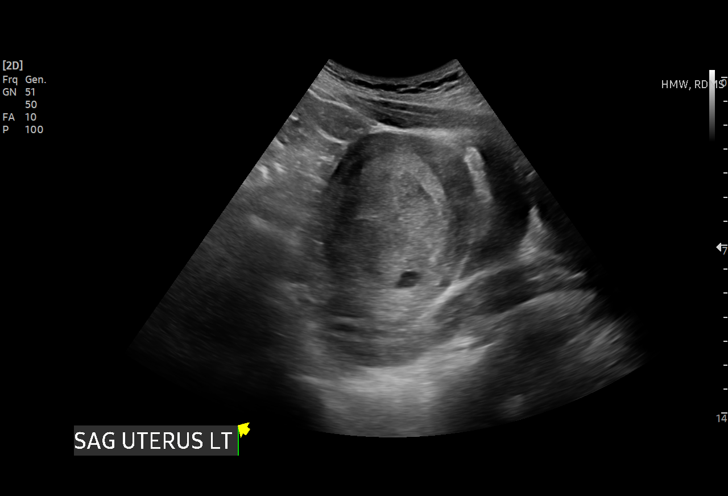
[im 16/51]
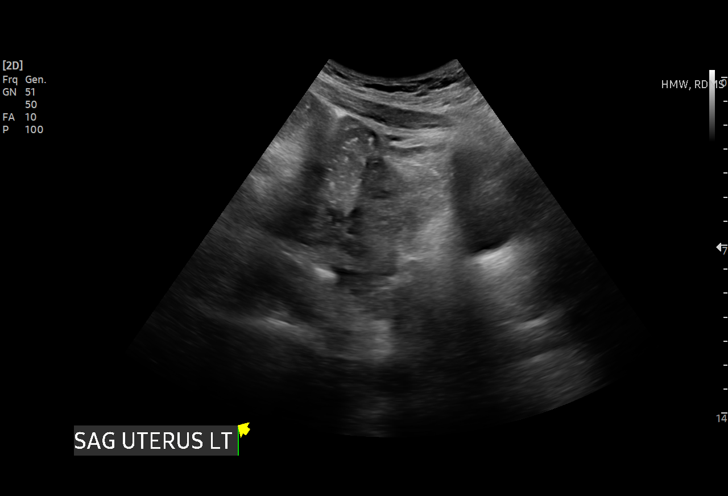
[im 20/51]
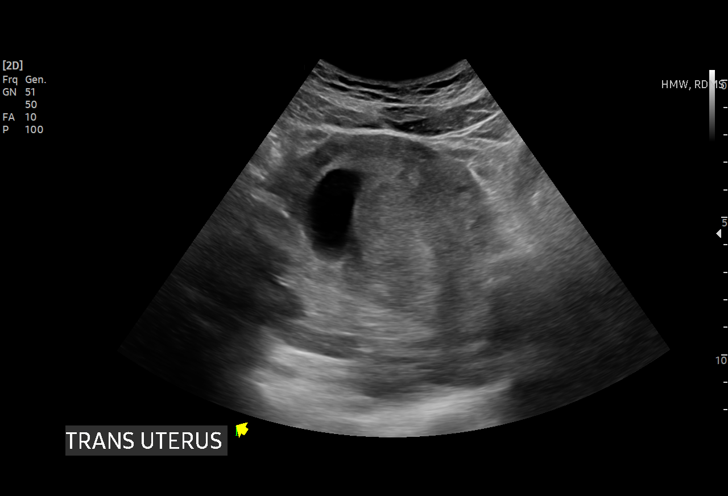
[im 24/51]
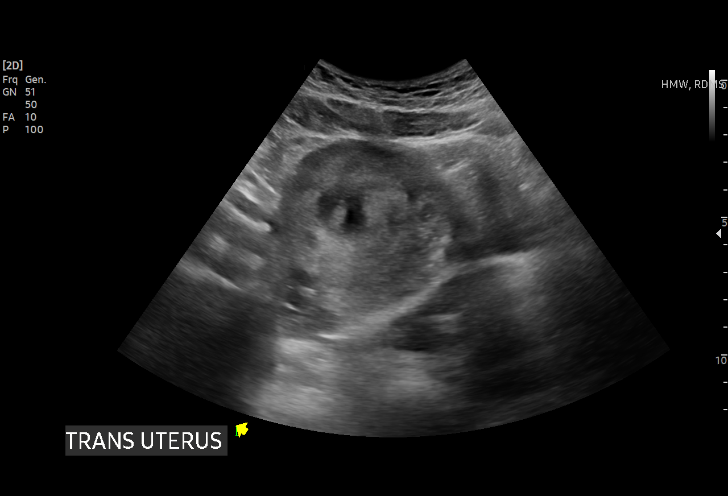
[im 27/51]
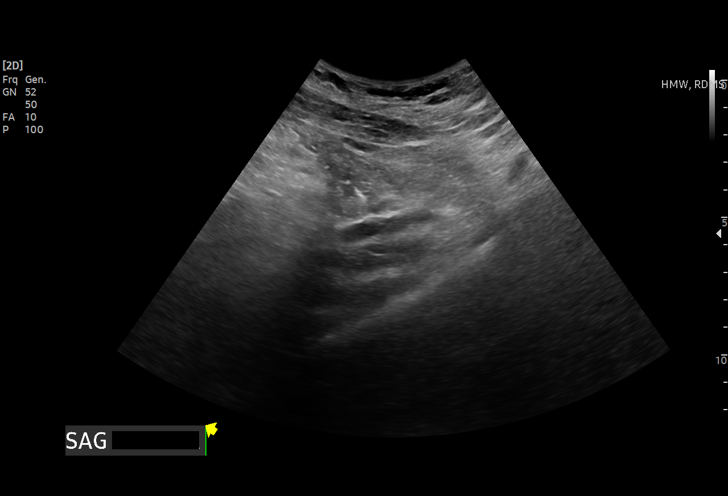
[im 29/51]
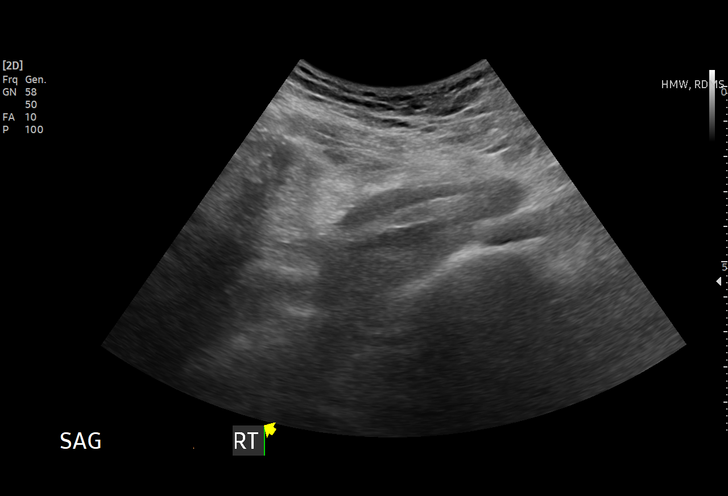
[im 33/51]
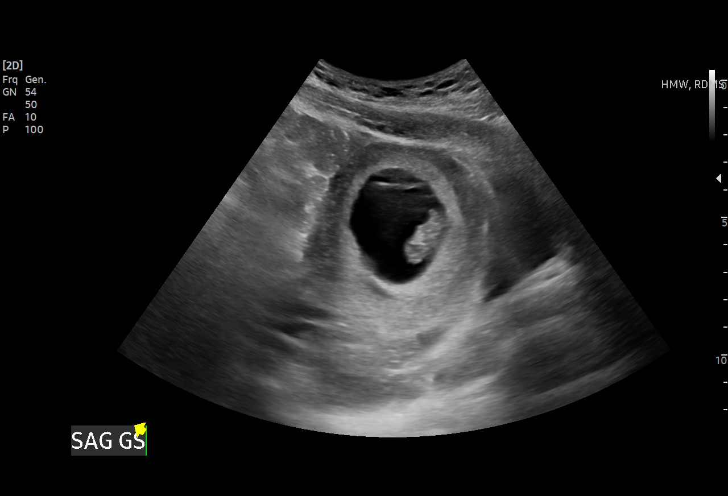
[im 37/51]
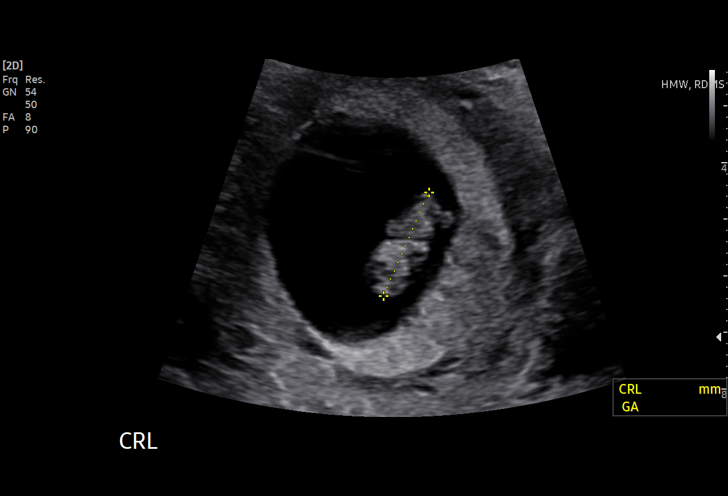
[im 41/51]
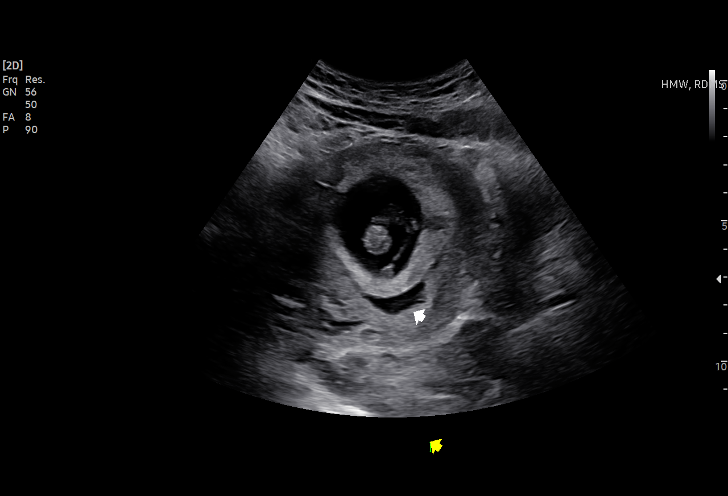
[im 45/51]
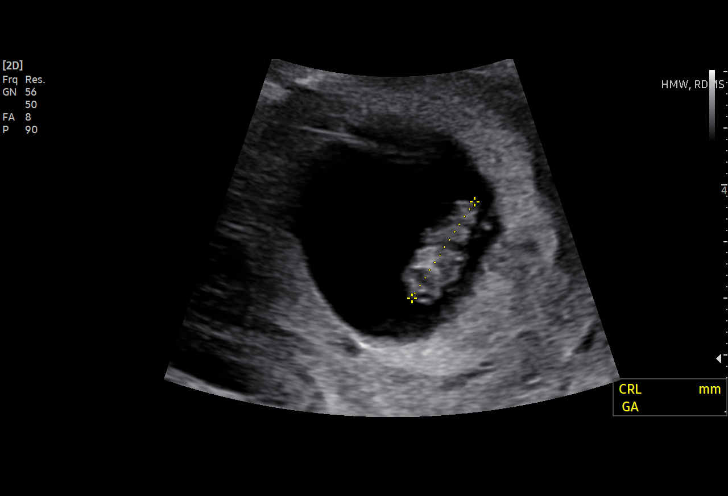
[im 49/51]
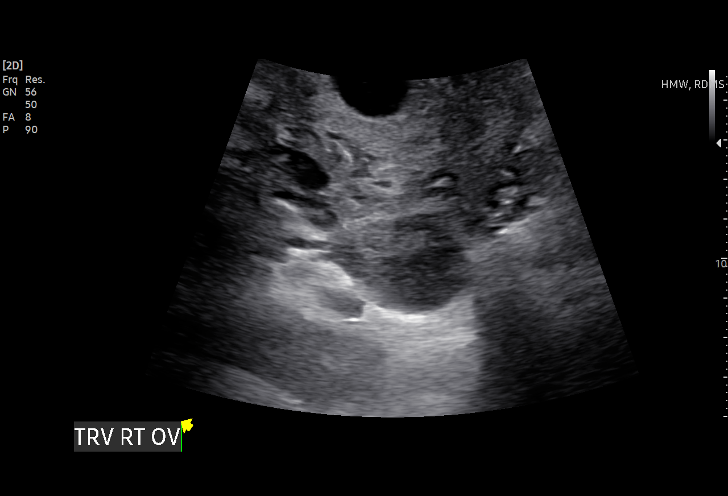

[Series 1: us ob comp less 14 wk · 2 acquisitions, 1 frame shown (2 of 2)]
[im 1/2]
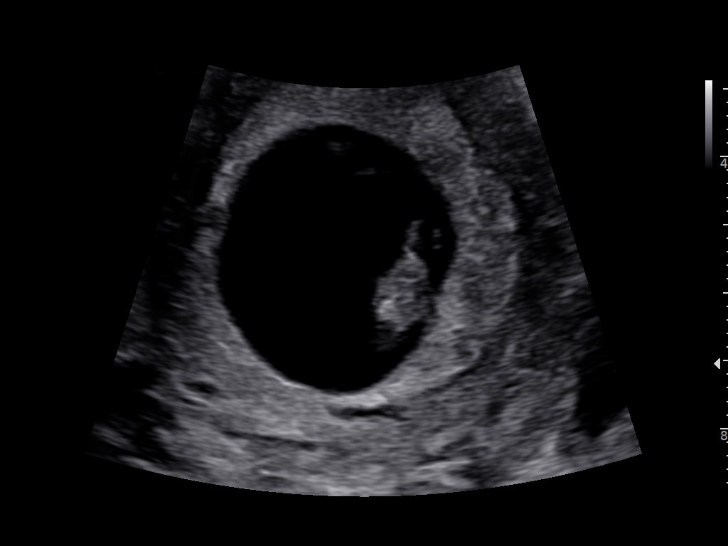

[15 of 28 positions shown; findings below may reference images not displayed]

FINDINGS: Intrauterine gestational sac: Single

Yolk sac:  Visualized.

Embryo:  Visualized.

Cardiac Activity: Visualized.

Heart Rate: 164 bpm

CRL:   20 mm   8 w 4 d                  US EDC: 07/12/2022

Subchorionic hemorrhage:  Small measuring 1.5 x 0.6 cm

Maternal uterus/adnexae: Single live intrauterine pregnancy. Small
subchorionic hemorrhage. Both ovaries are visualized and are normal.
No adnexal mass or pelvic free fluid.
IMPRESSION: 1. Single live intrauterine pregnancy estimated gestational age 8
weeks 4 days based on crown-rump length for ultrasound EDC
07/12/2022.
2. Small subchorionic hemorrhage.

## 2022-06-05 ENCOUNTER — Telehealth (INDEPENDENT_AMBULATORY_CARE_PROVIDER_SITE_OTHER): Payer: 59 | Admitting: Student

## 2022-06-05 DIAGNOSIS — Z3480 Encounter for supervision of other normal pregnancy, unspecified trimester: Secondary | ICD-10-CM

## 2022-06-05 DIAGNOSIS — O24410 Gestational diabetes mellitus in pregnancy, diet controlled: Secondary | ICD-10-CM

## 2022-06-05 DIAGNOSIS — Z3A34 34 weeks gestation of pregnancy: Secondary | ICD-10-CM

## 2022-06-05 NOTE — Progress Notes (Signed)
I connected with  Deanna Patterson on 06/05/22 at  1:15 PM EDT by MyChart Virtual Video Visit and verified that I am speaking with the correct person using two identifiers.   I discussed the limitations, risks, security and privacy concerns of performing an evaluation and management service by telephone and the availability of in person appointments. I also discussed with the patient that there may be a patient responsible charge related to this service. The patient expressed understanding and agreed to proceed.  Guy Begin, CMA 06/05/2022  1:16 PM

## 2022-06-05 NOTE — Progress Notes (Signed)
I connected with Retal Tonkinson 06/05/22 at  1:15 PM EDT by: MyChart video and verified that I am speaking with the correct person using two identifiers.  Patient is located at Home and provider is located at [facility].     I discussed the limitations, risks, security and privacy concerns of performing an evaluation and management service by MyChart video and the availability of in person appointments. I also discussed with the patient that there may be a patient responsible charge related to this service. By engaging in this virtual visit, you consent to the provision of healthcare.  Additionally, you authorize for your insurance to be billed for the services provided during this visit.  The patient expressed understanding and agreed to proceed.  The following staff members participated in the virtual visit:  Zwaye    PRENATAL VISIT NOTE  Subjective:  Deanna Patterson is a 26 y.o. G3P2002 at [redacted]w[redacted]d  for virtual visit for ongoing prenatal care.  She is currently monitored for the following issues for this low-risk pregnancy and has Other fatigue; Intractable migraine with aura without status migrainosus; Vitamin D deficiency; Asthma; Atypical squamous cells of undetermined significance (ASCUS) on Papanicolaou smear of cervix; Supervision of other normal pregnancy, antepartum; and Gestational diabetes mellitus (GDM), antepartum on their problem list.  Patient reports  itching on her belly; no itching at night, no itching on hands and feet. It is improved when she doesn't wear t-shirts .  Contractions: Not present. Vag. Bleeding: None.  Movement: Present. Denies leaking of fluid.   The following portions of the patient's history were reviewed and updated as appropriate: allergies, current medications, past family history, past medical history, past social history, past surgical history and problem list.   Objective:  There were no vitals filed for this visit. Self-Obtained  Fetal Status:      Movement: Present    Reviewed her blood sugars for the past two weeks:  Reviewed her fastings: all lower than 90  Reviewed her two hour PP: patient reports that only occasionally goes above 120 and s   Assessment and Plan:  Pregnancy: G3P2002 at [redacted]w[redacted]d  1. Diet controlled gestational diabetes mellitus (GDM), antepartum   2. Supervision of other normal pregnancy, antepartum   3. [redacted] weeks gestation of pregnancy   -Growth scan scheduled, encouraged patient to continue the good work of monitoring her blood sguars.  -patient will locate her BP cuff and take BP and report to me.  -recommended hydrocortisone cream Preterm labor symptoms and general obstetric precautions including but not limited to vaginal bleeding, contractions, leaking of fluid and fetal movement were reviewed in detail with the patient.  No follow-ups on file.  Future Appointments  Date Time Provider Department Center  06/19/2022  2:35 PM Madlyn Frankel [facility]  06/20/2022  3:45 PM [facility] US4 [facility]  06/26/2022 10:55 AM Reva Bores, MD [facility]  07/03/2022  2:15 PM Marylene Land, CNM [facility]  07/10/2022 11:15 AM Berle Mull [facility]  07/17/2022  1:15 PM [facility] NST [facility]  07/17/2022  2:15 PM Marylene Land, CNM [facility]     Time spent on virtual visit: 25 minutes  Marylene Land, [place]

## 2022-06-05 NOTE — Progress Notes (Signed)
Patient reports "small bumps" on stomach that itches. She stated that the bumps have been there for roughly 1 week. Denies any other concerns.   MFM growth ultrasound scheduled for 06/20/22 @ 330pm.  Appointment details were given to patient and she verbalized understanding.   Evolette Pendell, CMA

## 2022-06-16 ENCOUNTER — Ambulatory Visit: Payer: 59

## 2022-06-18 NOTE — Progress Notes (Unsigned)
   PRENATAL VISIT NOTE  Subjective:  Deanna Patterson is a 26 y.o. G3P2002 at [redacted]w[redacted]d being seen today for ongoing prenatal care.  She is currently monitored for the following issues for this {Blank single:19197::"high-risk","low-risk"} pregnancy and has Other fatigue; Intractable migraine with aura without status migrainosus; Vitamin D deficiency; Asthma; Atypical squamous cells of undetermined significance (ASCUS) on Papanicolaou smear of cervix; Supervision of other normal pregnancy, antepartum; and Gestational diabetes mellitus (GDM), antepartum on their problem list.  Patient reports {sx:[id number]}.   .  .   . Denies leaking of fluid.   The following portions of the patient's history were reviewed and updated as appropriate: allergies, current medications, past family history, past medical history, past social history, past surgical history and problem list.   Objective:  There were no vitals filed for this visit.  Fetal Status:           General:  Alert, oriented and cooperative. Patient is in no acute distress.  Skin: Skin is warm and dry. No rash noted.   Cardiovascular: Normal heart rate noted  Respiratory: Normal respiratory effort, no problems with respiration noted  Abdomen: Soft, gravid, appropriate for gestational age.        Pelvic: {Blank single:19197::"Cervical exam performed in the presence of a chaperone","Cervical exam deferred"}        Extremities: Normal range of motion.     Mental Status: Normal mood and affect. Normal behavior. Normal judgment and thought content.   Assessment and Plan:  Pregnancy: G3P2002 at [redacted]w[redacted]d There are no diagnoses linked to this encounter. {Blank single:19197::"Term","Preterm"} labor symptoms and general obstetric precautions including but not limited to vaginal bleeding, contractions, leaking of fluid and fetal movement were reviewed in detail with the patient. Please refer to After Visit Summary for other counseling recommendations.   No  follow-ups on file.  Future Appointments  Date Time Provider Department Center  06/19/2022  2:35 PM Madlyn Frankel [facility]  06/20/2022  3:30 PM [facility] NURSE [facility]  06/20/2022  3:45 PM [facility] US4 [facility]  06/26/2022 10:55 AM Reva Bores, MD [facility]  07/03/2022  2:15 PM Marylene Land, CNM [facility]  07/10/2022 11:15 AM Brand Males, CNM [facility]  07/17/2022  1:15 PM [facility] NST [facility]  07/17/2022  2:15 PM Marylene Land, CNM [facility]    Marylene Land, CNM

## 2022-06-19 ENCOUNTER — Encounter: Payer: Self-pay | Admitting: Student

## 2022-06-19 ENCOUNTER — Ambulatory Visit (INDEPENDENT_AMBULATORY_CARE_PROVIDER_SITE_OTHER): Payer: 59 | Admitting: Student

## 2022-06-19 ENCOUNTER — Other Ambulatory Visit (HOSPITAL_COMMUNITY)
Admission: RE | Admit: 2022-06-19 | Discharge: 2022-06-19 | Disposition: A | Discharge status: Home / Self Care | Payer: 59 | Source: Ambulatory Visit | Attending: Student | Admitting: Student

## 2022-06-19 ENCOUNTER — Other Ambulatory Visit: Payer: Self-pay

## 2022-06-19 VITALS — BP 117/74 | HR 95 | Wt 165.0 lb

## 2022-06-19 DIAGNOSIS — Z3A36 36 weeks gestation of pregnancy: Secondary | ICD-10-CM

## 2022-06-19 DIAGNOSIS — Z3480 Encounter for supervision of other normal pregnancy, unspecified trimester: Secondary | ICD-10-CM | POA: Diagnosis present

## 2022-06-20 ENCOUNTER — Encounter: Payer: Self-pay | Admitting: *Deleted

## 2022-06-20 ENCOUNTER — Ambulatory Visit: Payer: 59 | Attending: Student

## 2022-06-20 ENCOUNTER — Ambulatory Visit: Payer: 59 | Admitting: *Deleted

## 2022-06-20 VITALS — BP 125/64 | HR 92

## 2022-06-20 DIAGNOSIS — O24410 Gestational diabetes mellitus in pregnancy, diet controlled: Secondary | ICD-10-CM | POA: Diagnosis not present

## 2022-06-20 DIAGNOSIS — Z363 Encounter for antenatal screening for malformations: Secondary | ICD-10-CM | POA: Diagnosis present

## 2022-06-20 DIAGNOSIS — Z3480 Encounter for supervision of other normal pregnancy, unspecified trimester: Secondary | ICD-10-CM | POA: Diagnosis not present

## 2022-06-20 DIAGNOSIS — Z3A36 36 weeks gestation of pregnancy: Secondary | ICD-10-CM | POA: Insufficient documentation

## 2022-06-20 LAB — GC/CHLAMYDIA PROBE AMP (~~LOC~~) NOT AT ARMC
Chlamydia: NEGATIVE
Comment: NEGATIVE
Comment: NORMAL
Neisseria Gonorrhea: NEGATIVE

## 2022-06-24 LAB — STREP GP B CULTURE+RFLX: Strep Gp B Culture+Rflx: NEGATIVE

## 2022-06-26 ENCOUNTER — Telehealth (INDEPENDENT_AMBULATORY_CARE_PROVIDER_SITE_OTHER): Payer: Medicaid Other | Admitting: Family Medicine

## 2022-06-26 VITALS — BP 130/79 | HR 96

## 2022-06-26 DIAGNOSIS — O24410 Gestational diabetes mellitus in pregnancy, diet controlled: Secondary | ICD-10-CM

## 2022-06-26 DIAGNOSIS — Z3A37 37 weeks gestation of pregnancy: Secondary | ICD-10-CM

## 2022-06-26 DIAGNOSIS — Z3480 Encounter for supervision of other normal pregnancy, unspecified trimester: Secondary | ICD-10-CM

## 2022-06-26 NOTE — Progress Notes (Signed)
I connected with  Deanna Patterson on 06/26/22 at 10:55 AM EDT by MyChart video and verified that I am speaking with the correct person using two identifiers.   I discussed the limitations, risks, security and privacy concerns of performing an evaluation and management service by telephone and the availability of in person appointments. I also discussed with the patient that there may be a patient responsible charge related to this service. The patient expressed understanding and agreed to proceed.  Fasting BG range: 72 - 81 2 HR PP range: 85 - 127  [name], RN 06/26/2022  11:05 AM

## 2022-06-26 NOTE — Progress Notes (Signed)
OBSTETRICS PRENATAL VIRTUAL VISIT ENCOUNTER NOTE  [facility]   Patient location: Home  I connected with Tommas OlpChelsea Miller Vest on 06/26/22 at 10:55 AM EDT by MyChart Video Encounter and verified that I am speaking with the correct person using two identifiers. I discussed the limitations, risks, security and privacy concerns of performing an evaluation and management service virtually and the availability of in person appointments. I also discussed with the patient that there may be a patient responsible charge related to this service. The patient expressed understanding and agreed to proceed. Subjective:  Tommas OlpChelsea Miller Lorek is a 26 y.o. G3P2002 at 2271w5d being seen today for ongoing prenatal care.  She is currently monitored for the following issues for this low-risk pregnancy and has Other fatigue; Intractable migraine with aura without status migrainosus; Vitamin D deficiency; Asthma; Atypical squamous cells of undetermined significance (ASCUS) on Papanicolaou smear of cervix; Supervision of other normal pregnancy, antepartum; and Gestational diabetes mellitus (GDM), antepartum on their problem list.  Patient reports no complaints.  Contractions: Irregular. Vag. Bleeding: None.  Movement: Present. Denies any leaking of fluid.   The following portions of the patient's history were reviewed and updated as appropriate: allergies, current medications, past family history, past medical history, past social history, past surgical history and problem list.   Objective:   Vitals:   06/26/22 1105 06/26/22 1116  BP: 137/74 130/79  Pulse: 97 96    Fetal Status:     Movement: Present     General:  Alert, oriented and cooperative. Patient is in no acute distress.  Respiratory: Normal respiratory effort, no problems with respiration noted  Mental Status: Normal mood and affect. Normal behavior. Normal judgment and thought content.   Rest of physical exam deferred due to type of encounter  Imaging: US MFM OB FOLLOW UP  Result Date: 06/20/2022 ----------------------------------------------------------------------  OBSTETRICS REPORT                       (Signed Final 06/20/2022 04:50 pm) ---------------------------------------------------------------------- Patient Info  ID #:       [id number]                          D.O.B.:  07-29-1996 (26 yrs)  Name:       Staci AcostaHELSEA MILLER                  Visit Date: 06/20/2022 04:31 pm              [place] ---------------------------------------------------------------------- Performed By  Attending:        Ma RingsVictor Fang MD         Ref. Address:     [address]                                                             [place], [place]                                                             [id number]  Performed By:     Rodrigo Ran BS      [facility]         [facility]                                                             [place] [facility]                                                             [facility]  Referred By:      Aletha Halim MD ---------------------------------------------------------------------- Orders  #  Description                           Code        Ordered By  1  [place] MFM OB FOLLOW UP                   [id number]    Maye Hides ----------------------------------------------------------------------  #  Order #                     Accession #                Episode #  1  [id number]                   [id number]                 [id number] ---------------------------------------------------------------------- Indications  Gestational diabetes in pregnancy, diet        O24.410  controlled  Encounter for antenatal screening for          Z36.3  malformations  Declined genetic screening  [redacted] weeks gestation of pregnancy                Z3A.36  ---------------------------------------------------------------------- Fetal Evaluation  Num Of Fetuses:         1  Fetal Heart Rate(bpm):  145  Cardiac Activity:       Observed  Presentation:           Cephalic  Placenta:               Anterior  P. Cord Insertion:      Previously Visualized  Amniotic Fluid  AFI FV:      Within normal limits  AFI Sum(cm)     %Tile       Largest Pocket(cm)  19.35           74          8.42  RUQ(cm)       RLQ(cm)       LUQ(cm)  LLQ(cm)  3.22          1.49          6.22           8.42 ---------------------------------------------------------------------- Biometry  BPD:     85.27  mm     G. Age:  34w 3d          7  %    CI:        80.66   %    70 - 86                                                          FL/HC:      22.5   %    20.8 - 22.6  HC:    299.84   mm     G. Age:  33w 2d        < 1  %    HC/AC:      0.85        0.92 - 1.05  AC:    351.61   mm     G. Age:  39w 0d         98  %    FL/BPD:     79.1   %    71 - 87  FL:      67.45  mm     G. Age:  34w 5d          6  %    FL/AC:      19.2   %    20 - 24  LV:          7  mm  Est. FW:    3044  gm    6 lb 11 oz      55  % ---------------------------------------------------------------------- OB History  Gravidity:    3         Term:   2        Prem:   0        SAB:   0  TOP:          0       Ectopic:  0        Living: 2 ---------------------------------------------------------------------- Gestational Age  U/S Today:     35w 3d                                        EDD:   07/22/22  Best:          36w 6d     Det. ByMarcella Dubs         EDD:   07/12/22                                      (12/04/21) ---------------------------------------------------------------------- Anatomy  Cranium:               Appears normal         LVOT:                   Previously seen  Cavum:  Appears normal         Aortic Arch:            Previously seen  Ventricles:            Appears normal         Ductal Arch:             Previously seen  Choroid Plexus:        Previously seen        Diaphragm:              Appears normal  Cerebellum:            Previously seen        Stomach:                Appears normal, left                                                                        sided  Posterior Fossa:       Previously seen        Abdomen:                Appears normal  Nuchal Fold:           Not applicable (>20    Abdominal Wall:         Previously seen                         wks GA)  Face:                  Orbits and profile     Cord Vessels:           Previously seen                         previously seen  Lips:                  Previously seen        Kidneys:                Appear normal  Palate:                Previously seen        Bladder:                Appears normal  Thoracic:              Previously seen        Spine:                  Previously seen  Heart:                 Previously seen        Upper Extremities:      Previously seen  RVOT:                  Previously seen        Lower Extremities:      Previously seen  Other:  Fetus appears to be female. Heels/feet and open hands/5th digits,  VC, 3VV and 3VTV, lenses, nasal bone prev visualized. ---------------------------------------------------------------------- Cervix Uterus Adnexa  Cervix  Not visualized (advanced GA >24wks)  Uterus  No abnormality visualized.  Right Ovary  Within normal limits.  Left Ovary  Not visualized.  Cul De Sac  No free fluid seen.  Adnexa  No abnormality visualized. ---------------------------------------------------------------------- Comments  This patient was seen for a follow up growth scan due to  recently diagnosed diet-controlled gestational diabetes.  She  reports that her fingerstick values have mostly been within  normal limits.  She was informed that the fetal growth and amniotic fluid  level appears appropriate for her gestational age.  Due to gestational diabetes, delivery should probably occur at  around 66  weeks.  No further exams were scheduled in our office. ----------------------------------------------------------------------                   Johnell Comings, MD Electronically Signed Final Report   06/20/2022 04:50 pm ----------------------------------------------------------------------   Assessment and Plan:  Pregnancy: [id number] at [redacted]w[redacted]d 1. Supervision of other normal pregnancy, antepartum Continue routine prenatal care. IOL scheduled at 39 weeks  2. Diet controlled gestational diabetes mellitus (GDM), antepartum On dit. CBGs are good, to send log via MyChart Last u/s for growth at 55%  Term labor symptoms and general obstetric precautions including but not limited to vaginal bleeding, contractions, leaking of fluid and fetal movement were reviewed in detail with the patient. I discussed the assessment and treatment plan with the patient. The patient was provided an opportunity to ask questions and all were answered. The patient agreed with the plan and demonstrated an understanding of the instructions. The patient was advised to call back or seek an in-person office evaluation/go to [facility] at [facility] for any urgent or concerning symptoms. Please refer to After Visit Summary for other counseling recommendations.   I provided 11 minutes of face-to-face time during this encounter.  Return in 1 week (on 07/03/2022).  Future Appointments  Date Time Provider [place]  07/03/2022  2:15 PM [facility]  07/06/2022 12:00 AM MC-LD SCHED ROOM MC-INDC None  07/10/2022 11:15 AM [place] [facility]  07/17/2022  1:15 PM [facility] NST [facility]  07/17/2022  2:15 PM Ardean Larsen, Mervyn Skeeters, CNM [facility]    Donnamae Jude, MD Center for [facility]

## 2022-06-27 ENCOUNTER — Encounter (HOSPITAL_COMMUNITY): Payer: Self-pay

## 2022-06-27 ENCOUNTER — Telehealth (HOSPITAL_COMMUNITY): Payer: Self-pay | Admitting: *Deleted

## 2022-06-27 NOTE — Telephone Encounter (Signed)
Preadmission screen  

## 2022-06-28 NOTE — Progress Notes (Signed)
   PRENATAL VISIT NOTE  Subjective:  Deanna Patterson is a 26 y.o. G3P2002 at [redacted]w[redacted]d being seen today for ongoing prenatal care.  She is currently monitored for the following issues for this high-risk pregnancy and has Other fatigue; Intractable migraine with aura without status migrainosus; Vitamin D deficiency; Asthma; Atypical squamous cells of undetermined significance (ASCUS) on Papanicolaou smear of cervix; Supervision of other normal pregnancy, antepartum; and Gestational diabetes mellitus (GDM), antepartum on their problem list.  Patient reports no complaints.  Contractions: Irritability.  .  Movement: Present. Denies leaking of fluid.   The following portions of the patient's history were reviewed and updated as appropriate: allergies, current medications, past family history, past medical history, past social history, past surgical history and problem list.   Objective:   Vitals:   07/03/22 1436  BP: 117/75  Pulse: 94  Weight: 169 lb (76.7 kg)    Fetal Status: Fetal Heart Rate (bpm): 136 Fundal Height: 39 cm Movement: Present     General:  Alert, oriented and cooperative. Patient is in no acute distress.  Skin: Skin is warm and dry. No rash noted.   Cardiovascular: Normal heart rate noted  Respiratory: Normal respiratory effort, no problems with respiration noted  Abdomen: Soft, gravid, appropriate for gestational age.  Pain/Pressure: Absent     Pelvic: Cervical exam deferred        Extremities: Normal range of motion.     Mental Status: Normal mood and affect. Normal behavior. Normal judgment and thought content.   Assessment and Plan:  Pregnancy: G3P2002 at [redacted]w[redacted]d 1. [redacted] weeks gestation of pregnancy   2. Supervision of other normal pregnancy, antepartum    -iol scheduled for 39 weeks ; reviewed blood sugars-she did not bring log but she reports that her sugars are the same as they were before -reviewed fetal kick counts and when to come to MAU  Term labor symptoms  and general obstetric precautions including but not limited to vaginal bleeding, contractions, leaking of fluid and fetal movement were reviewed in detail with the patient. Please refer to After Visit Summary for other counseling recommendations.   No follow-ups on file.  Future Appointments  Date Time Provider Department Center  07/06/2022 12:00 AM [facility] SCHED ROOM [facility]IN[facility] None  07/10/2022 11:15 AM Brand Males, CNM [facility] [place]  07/17/2022  1:15 PM [facility] NST [facility]  07/17/2022  2:15 PM Marylene Land, CNM [facility]    Marylene Land, [place]

## 2022-06-29 ENCOUNTER — Other Ambulatory Visit: Payer: Self-pay | Admitting: Advanced Practice Midwife

## 2022-06-30 ENCOUNTER — Telehealth (HOSPITAL_COMMUNITY): Payer: Self-pay | Admitting: *Deleted

## 2022-06-30 ENCOUNTER — Encounter (HOSPITAL_COMMUNITY): Payer: Self-pay | Admitting: *Deleted

## 2022-06-30 NOTE — Telephone Encounter (Signed)
Preadmission screen  

## 2022-07-03 ENCOUNTER — Ambulatory Visit (INDEPENDENT_AMBULATORY_CARE_PROVIDER_SITE_OTHER): Payer: Medicaid Other | Admitting: Student

## 2022-07-03 ENCOUNTER — Other Ambulatory Visit: Payer: Self-pay

## 2022-07-03 VITALS — BP 117/75 | HR 94 | Wt 169.0 lb

## 2022-07-03 DIAGNOSIS — Z3483 Encounter for supervision of other normal pregnancy, third trimester: Secondary | ICD-10-CM

## 2022-07-03 DIAGNOSIS — Z3480 Encounter for supervision of other normal pregnancy, unspecified trimester: Secondary | ICD-10-CM

## 2022-07-03 DIAGNOSIS — Z3A38 38 weeks gestation of pregnancy: Secondary | ICD-10-CM

## 2022-07-06 ENCOUNTER — Encounter (HOSPITAL_COMMUNITY): Payer: Self-pay | Admitting: Family Medicine

## 2022-07-06 ENCOUNTER — Inpatient Hospital Stay (HOSPITAL_COMMUNITY): Payer: Medicaid Other | Admitting: Anesthesiology

## 2022-07-06 ENCOUNTER — Inpatient Hospital Stay (HOSPITAL_COMMUNITY): Payer: Medicaid Other

## 2022-07-06 ENCOUNTER — Inpatient Hospital Stay (HOSPITAL_COMMUNITY)
Admission: AD | Admit: 2022-07-06 | Discharge: 2022-07-08 | DRG: 807 | Disposition: A | Discharge status: Home / Self Care | Payer: Medicaid Other | Attending: Obstetrics and Gynecology | Admitting: Obstetrics and Gynecology

## 2022-07-06 ENCOUNTER — Other Ambulatory Visit: Payer: Self-pay

## 2022-07-06 DIAGNOSIS — Z880 Allergy status to penicillin: Secondary | ICD-10-CM

## 2022-07-06 DIAGNOSIS — Z3A39 39 weeks gestation of pregnancy: Secondary | ICD-10-CM

## 2022-07-06 DIAGNOSIS — O24419 Gestational diabetes mellitus in pregnancy, unspecified control: Secondary | ICD-10-CM | POA: Diagnosis present

## 2022-07-06 DIAGNOSIS — O99214 Obesity complicating childbirth: Secondary | ICD-10-CM | POA: Diagnosis present

## 2022-07-06 DIAGNOSIS — O99824 Streptococcus B carrier state complicating childbirth: Secondary | ICD-10-CM | POA: Diagnosis present

## 2022-07-06 DIAGNOSIS — O24420 Gestational diabetes mellitus in childbirth, diet controlled: Secondary | ICD-10-CM | POA: Diagnosis present

## 2022-07-06 DIAGNOSIS — O24410 Gestational diabetes mellitus in pregnancy, diet controlled: Secondary | ICD-10-CM

## 2022-07-06 DIAGNOSIS — O26893 Other specified pregnancy related conditions, third trimester: Secondary | ICD-10-CM | POA: Diagnosis present

## 2022-07-06 DIAGNOSIS — Z3480 Encounter for supervision of other normal pregnancy, unspecified trimester: Principal | ICD-10-CM

## 2022-07-06 LAB — TYPE AND SCREEN
ABO/RH(D): A POS
Antibody Screen: NEGATIVE

## 2022-07-06 LAB — CBC
HCT: 34.8 % — ABNORMAL LOW (ref 36.0–46.0)
Hemoglobin: 11.7 g/dL — ABNORMAL LOW (ref 12.0–15.0)
MCH: 32.4 pg (ref 26.0–34.0)
MCHC: 33.6 g/dL (ref 30.0–36.0)
MCV: 96.4 fL (ref 80.0–100.0)
Platelets: 220 10*3/uL (ref 150–400)
RBC: 3.61 MIL/uL — ABNORMAL LOW (ref 3.87–5.11)
RDW: 14.4 % (ref 11.5–15.5)
WBC: 7.1 10*3/uL (ref 4.0–10.5)
nRBC: 0 % (ref 0.0–0.2)

## 2022-07-06 LAB — GLUCOSE, CAPILLARY
Glucose-Capillary: 99 mg/dL (ref 70–99)
Glucose-Capillary: 110 mg/dL — ABNORMAL HIGH (ref 70–99)

## 2022-07-06 MED ORDER — SODIUM CHLORIDE 0.9 % IV SOLN: INTRAVENOUS | Status: DC | PRN

## 2022-07-06 MED ORDER — OXYCODONE-ACETAMINOPHEN 5-325 MG PO TABS: 2.0000 | ORAL_TABLET | ORAL | Status: DC | PRN

## 2022-07-06 MED ORDER — PHENYLEPHRINE 80 MCG/ML (10ML) SYRINGE FOR IV PUSH (FOR BLOOD PRESSURE SUPPORT): 80.0000 ug | PREFILLED_SYRINGE | INTRAVENOUS | Status: DC | PRN

## 2022-07-06 MED ORDER — DIBUCAINE (PERIANAL) 1 % EX OINT
1.0000 | TOPICAL_OINTMENT | CUTANEOUS | Status: DC | PRN
Start: 2022-07-06 — End: 2022-07-08

## 2022-07-06 MED ORDER — OXYTOCIN-SODIUM CHLORIDE 30-0.9 UT/500ML-% IV SOLN: 1.0000 m[IU]/min | INTRAVENOUS | Status: DC

## 2022-07-06 MED ORDER — DIPHENHYDRAMINE HCL 50 MG/ML IJ SOLN: 12.5000 mg | INTRAMUSCULAR | Status: DC | PRN

## 2022-07-06 MED ORDER — TERBUTALINE SULFATE 1 MG/ML IJ SOLN: 0.2500 mg | Freq: Once | INTRAMUSCULAR | Status: DC | PRN

## 2022-07-06 MED ORDER — ACETAMINOPHEN 325 MG PO TABS: 650.0000 mg | ORAL_TABLET | ORAL | Status: DC | PRN

## 2022-07-06 MED ORDER — MEASLES, MUMPS & RUBELLA VAC IJ SOLR: 0.5000 mL | Freq: Once | INTRAMUSCULAR | Status: DC

## 2022-07-06 MED ORDER — ONDANSETRON HCL 4 MG/2ML IJ SOLN: 4.0000 mg | INTRAMUSCULAR | Status: DC | PRN

## 2022-07-06 MED ORDER — FENTANYL-BUPIVACAINE-NACL 0.5-0.125-0.9 MG/250ML-% EP SOLN
12.0000 mL/h | EPIDURAL | Status: DC | PRN
  Administered 2022-07-06: 10.5 mL/h via EPIDURAL
  Filled 2022-07-06: qty 250

## 2022-07-06 MED ORDER — PRENATAL MULTIVITAMIN CH
1.0000 | ORAL_TABLET | Freq: Every day | ORAL | Status: DC
  Administered 2022-07-07 – 2022-07-08 (×2): 1 via ORAL
  Filled 2022-07-06 (×2): qty 1

## 2022-07-06 MED ORDER — OXYTOCIN-SODIUM CHLORIDE 30-0.9 UT/500ML-% IV SOLN
1.0000 m[IU]/min | INTRAVENOUS | Status: DC
  Administered 2022-07-06: 4 m[IU]/min via INTRAVENOUS
  Filled 2022-07-06: qty 500

## 2022-07-06 MED ORDER — ONDANSETRON HCL 4 MG PO TABS: 4.0000 mg | ORAL_TABLET | ORAL | Status: DC | PRN

## 2022-07-06 MED ORDER — OXYTOCIN-SODIUM CHLORIDE 30-0.9 UT/500ML-% IV SOLN: 2.5000 [IU]/h | INTRAVENOUS | Status: DC

## 2022-07-06 MED ORDER — LACTATED RINGERS IV SOLN: 500.0000 mL | INTRAVENOUS | Status: DC | PRN

## 2022-07-06 MED ORDER — EPHEDRINE 5 MG/ML INJ: 10.0000 mg | INTRAVENOUS | Status: DC | PRN

## 2022-07-06 MED ORDER — SOD CITRATE-CITRIC ACID 500-334 MG/5ML PO SOLN: 30.0000 mL | ORAL | Status: DC | PRN

## 2022-07-06 MED ORDER — SODIUM CHLORIDE 0.9% FLUSH: 3.0000 mL | INTRAVENOUS | Status: DC | PRN

## 2022-07-06 MED ORDER — LACTATED RINGERS IV SOLN
500.0000 mL | Freq: Once | INTRAVENOUS | Status: AC
  Administered 2022-07-06: 500 mL via INTRAVENOUS

## 2022-07-06 MED ORDER — MISOPROSTOL 25 MCG QUARTER TABLET
25.0000 ug | ORAL_TABLET | ORAL | Status: DC | PRN
  Filled 2022-07-06: qty 1

## 2022-07-06 MED ORDER — LACTATED RINGERS IV SOLN: INTRAVENOUS | Status: DC

## 2022-07-06 MED ORDER — OXYCODONE HCL 5 MG PO TABS: 5.0000 mg | ORAL_TABLET | ORAL | Status: DC | PRN

## 2022-07-06 MED ORDER — DIPHENHYDRAMINE HCL 25 MG PO CAPS: 25.0000 mg | ORAL_CAPSULE | Freq: Four times a day (QID) | ORAL | Status: DC | PRN

## 2022-07-06 MED ORDER — SIMETHICONE 80 MG PO CHEW: 80.0000 mg | CHEWABLE_TABLET | ORAL | Status: DC | PRN

## 2022-07-06 MED ORDER — LIDOCAINE HCL (PF) 1 % IJ SOLN
INTRAMUSCULAR | Status: DC | PRN
  Administered 2022-07-06 (×2): 4 mL via EPIDURAL

## 2022-07-06 MED ORDER — FLEET ENEMA 7-19 GM/118ML RE ENEM: 1.0000 | ENEMA | RECTAL | Status: DC | PRN

## 2022-07-06 MED ORDER — COCONUT OIL OIL: 1.0000 | TOPICAL_OIL | Status: DC | PRN

## 2022-07-06 MED ORDER — SENNOSIDES-DOCUSATE SODIUM 8.6-50 MG PO TABS
2.0000 | ORAL_TABLET | ORAL | Status: DC
  Administered 2022-07-07: 2 via ORAL
  Filled 2022-07-06: qty 2

## 2022-07-06 MED ORDER — MISOPROSTOL 50MCG HALF TABLET
50.0000 ug | ORAL_TABLET | ORAL | Status: DC | PRN
  Administered 2022-07-06: 50 ug via BUCCAL
  Filled 2022-07-06: qty 1

## 2022-07-06 MED ORDER — OXYCODONE HCL 5 MG PO TABS: 10.0000 mg | ORAL_TABLET | ORAL | Status: DC | PRN

## 2022-07-06 MED ORDER — WITCH HAZEL-GLYCERIN EX PADS: 1.0000 | MEDICATED_PAD | CUTANEOUS | Status: DC | PRN

## 2022-07-06 MED ORDER — TETANUS-DIPHTH-ACELL PERTUSSIS 5-2.5-18.5 LF-MCG/0.5 IM SUSY: 0.5000 mL | PREFILLED_SYRINGE | Freq: Once | INTRAMUSCULAR | Status: DC

## 2022-07-06 MED ORDER — IBUPROFEN 600 MG PO TABS
600.0000 mg | ORAL_TABLET | Freq: Four times a day (QID) | ORAL | Status: DC
  Administered 2022-07-07 – 2022-07-08 (×6): 600 mg via ORAL
  Filled 2022-07-06 (×7): qty 1

## 2022-07-06 MED ORDER — ZOLPIDEM TARTRATE 5 MG PO TABS: 5.0000 mg | ORAL_TABLET | Freq: Every evening | ORAL | Status: DC | PRN

## 2022-07-06 MED ORDER — BENZOCAINE-MENTHOL 20-0.5 % EX AERO: 1.0000 | INHALATION_SPRAY | CUTANEOUS | Status: DC | PRN

## 2022-07-06 MED ORDER — OXYCODONE-ACETAMINOPHEN 5-325 MG PO TABS: 1.0000 | ORAL_TABLET | ORAL | Status: DC | PRN

## 2022-07-06 MED ORDER — OXYTOCIN BOLUS FROM INFUSION
333.0000 mL | Freq: Once | INTRAVENOUS | Status: AC
  Administered 2022-07-06: 333 mL via INTRAVENOUS

## 2022-07-06 MED ORDER — LIDOCAINE HCL (PF) 1 % IJ SOLN: 30.0000 mL | INTRAMUSCULAR | Status: DC | PRN

## 2022-07-06 MED ORDER — ONDANSETRON HCL 4 MG/2ML IJ SOLN: 4.0000 mg | Freq: Four times a day (QID) | INTRAMUSCULAR | Status: DC | PRN

## 2022-07-06 MED ORDER — SODIUM CHLORIDE 0.9% FLUSH: 3.0000 mL | Freq: Two times a day (BID) | INTRAVENOUS | Status: DC

## 2022-07-06 NOTE — H&P (Addendum)
OBSTETRIC ADMISSION HISTORY AND PHYSICAL  Deanna Patterson is a 26 y.o. female G3P2002 with IUP at 31w1dby U/S presenting for IOL . She reports +FMs, No LOF, no VB, no blurry vision, headaches or peripheral edema, and RUQ pain.  She plans on breast feeding. She requests depo for birth control. [place] --->  Estimated Date of Delivery: 07/12/22  Sono:  _0 , CWD, normal anatomy, vertex presentation,  3044g, 55% EFW   Prenatal History/Complications:  - migraine w/ aura  - ASCUS on pap smear - GDM, diet controlled   Past Medical History: Past Medical History:  Diagnosis Date   Asthma    GBS bacteriuria 05/30/2020   +GBS in urine 05/24/20  PCN allergy    Gestational diabetes    Migraines     Past Surgical History: Past Surgical History:  Procedure Laterality Date   NO PAST SURGERIES      Obstetrical History: OB History     Gravida  3   Para  2   Term  2   Preterm  0   AB  0   Living  2      SAB  0   IAB  0   Ectopic  0   Multiple  0   Live Births  2           Social History Social History   Socioeconomic History   Marital status: Married    Spouse name: Not on file   Number of children: Not on file   Years of education: Not on file   Highest education level: Not on file  Occupational History   Not on file  Tobacco Use   Smoking status: Never   Smokeless tobacco: Never  Vaping Use   Vaping Use: Never used  Substance and Sexual Activity   Alcohol use: Not Currently    Alcohol/week: 0.0 standard drinks of alcohol    Comment: wine daily until pregnancy   Drug use: No   Sexual activity: Yes    Birth control/protection: None  Other Topics Concern   Not on file  Social History Narrative   Not on file   Social Determinants of Health   Financial Resource Strain: Not on file  Food Insecurity: No Food Insecurity (06/19/2022)   Hunger Vital Sign    Worried About Running Out of Food in  the Last Year: Never true    Ran Out of Food in the Last Year: Never true  Transportation Needs: No Transportation Needs (06/19/2022)   PRAPARE - THydrologist(Medical): No    Lack of Transportation (Non-Medical): No  Physical Activity: Not on file  Stress: Not on file  Social Connections: Not on file    Family History: Family History  Problem Relation Age of Onset   Hyperlipidemia Mother    Hypertension Mother    Thyroid disease Mother    Hyperlipidemia Maternal Grandmother    Hypertension Maternal Grandmother     Allergies: Allergies  Allergen Reactions   Peanut-Containing Drug Products Rash, Shortness Of Breath and Swelling   Eggs Or Egg-Derived Products Swelling    Swelling of the lips, tongue and throat.   Peanuts [Peanut Oil] Swelling    Swelling of the lips, tongue and throat.   Penicillins Rash    Pt denies allergies to latex, iodine, or shellfish.  Medications Prior to Admission  Medication Sig Dispense Refill Last  Dose   acetaminophen (TYLENOL) 325 MG tablet Take 650 mg by mouth every 6 (six) hours as needed.      albuterol (VENTOLIN HFA) 108 (90 Base) MCG/ACT inhaler Inhale 1-2 puffs into the lungs every 6 (six) hours as needed for wheezing or shortness of breath. 8 g 2    blood glucose meter kit and supplies KIT Dispense based on patient and insurance preference. Use up to four times daily as directed. 1 each 0      Review of Systems   All systems reviewed and negative except as stated in HPI  currently breastfeeding. General appearance: alert and cooperative Lungs: clear to auscultation bilaterally Heart: regular rate and rhythm Abdomen: soft, non-tender; bowel sounds normal Extremities: Homans sign is negative, no sign of DVT Presentation: cephalic Fetal monitoringBaseline: 145 bpm, Variability: Good {> 6 bpm), Accelerations: Reactive, and Decelerations: Absent Uterine activityNone     Prenatal labs: ABO, Rh:  A/Positive/-- (01/31 1129) Antibody: Negative (01/31 1129) Rubella: 1.25 (01/31 1129) RPR: Non Reactive (06/15 0931)  HBsAg: Negative (01/31 1129)  HIV: Non Reactive (06/15 0931)  GBS: Negative/-- (07/27 1558)  2 hr Glucola: positive Genetic screening:  declined Anatomy US: normal   Prenatal Transfer Tool  Maternal Diabetes: Yes:  Diabetes Type:  Diet controlled Genetic Screening: Declined Maternal Ultrasounds/Referrals: Normal Fetal Ultrasounds or other Referrals:  None Maternal Substance Abuse:  No Significant Maternal Medications:  None Significant Maternal Lab Results: Group B Strep negative  No results found for this or any previous visit (from the past 24 hour(s)).  Patient Active Problem List   Diagnosis Date Noted   Gestational diabetes mellitus (GDM), antepartum 05/13/2022   Supervision of other normal pregnancy, antepartum 12/25/2021   Asthma    Other fatigue 03/29/2020   Intractable migraine with aura without status migrainosus 03/29/2020   Vitamin D deficiency 03/29/2020   Atypical squamous cells of undetermined significance (ASCUS) on Papanicolaou smear of cervix 04/30/2017    Assessment/Plan:  Deanna Patterson is a 26 y.o. G3P2002 at 51w1dhere for IOL for diet controlled GDM  #Labor: SVE 2/50/-2. Start cytotec 543m buccal, foley bulb placed and filled to 30cc.  #Pain: Planning epidural  #FWB: Cat 1 #ID:  GBS neg #MOF: breast #MOC: depo #A1GDM: CBG 99, ctm   EmDarci CurrentDO  Center for [facility] 07/06/2022, 1:19 PM  GME ATTESTATION:  Evaluation and management procedures were performed by the [facility] Resident under my supervision. I was immediately available for direct supervision, assistance and direction throughout this encounter.  I also confirm that I have verified the information documented in the resident's note, and that I have also personally reperformed the pertinent components of the physical  exam and all of the medical decision making activities.  I have also made any necessary editorial changes.  JeShelda PalDO OB Fellow, [facility] [place]/13/2023 2:47 PM

## 2022-07-06 NOTE — Progress Notes (Addendum)
Labor Progress Note Deanna Patterson is a 26 y.o. G3P2002 at [redacted]w[redacted]d presented for IOL  S: Pt feeling more pressure. Desires AROM  O:  BP 136/78   Pulse 90   Temp 98.3 F (36.8 C) (Oral)   Resp 16   Ht 5' (1.524 m)   Wt 77.1 kg   LMP  (LMP Unknown)   BMI 33.20 kg/m  EFM: 130bpm/Moderate variability/ 15x15 accels/ None decels  CVE: Dilation: 4.5 Effacement (%): 70 Station: -2 Presentation: Vertex Exam by:: Dr. Hyacinth Meeker   A&P: 26 y.o. [id number] [redacted]w[redacted]d  #Labor: Progressing well. AROM at 1847, clear fluid. S/p cytotec x1. Start 4x4 pitocin. #Pain: Epidural upon request #FWB: CAT 1 #GBS negative  Deanna Crocker Mercado-Ortiz, DO 7:09 PM

## 2022-07-06 NOTE — Anesthesia Preprocedure Evaluation (Signed)
Anesthesia Evaluation  Patient identified by MRN, date of birth, ID band Patient awake    Reviewed: Allergy & Precautions, Patient's Chart, lab work & pertinent test results  Airway Mallampati: II  TM Distance: >3 FB Neck ROM: Full    Dental no notable dental hx.    Pulmonary asthma ,    Pulmonary exam normal        Cardiovascular negative cardio ROS Normal cardiovascular exam     Neuro/Psych  Headaches, negative psych ROS   GI/Hepatic Neg liver ROS, GERD  ,  Endo/Other  diabetes, GestationalObesity  Renal/GU negative Renal ROS  negative genitourinary   Musculoskeletal negative musculoskeletal ROS (+)   Abdominal (+) + obese,   Peds  Hematology  (+) Blood dyscrasia, anemia ,   Anesthesia Other Findings   Reproductive/Obstetrics (+) Pregnancy                             Anesthesia Physical Anesthesia Plan  ASA: 2  Anesthesia Plan: Epidural   Post-op Pain Management:    Induction:   PONV Risk Score and Plan:   Airway Management Planned: Natural Airway  Additional Equipment: None  Intra-op Plan:   Post-operative Plan:   Informed Consent: I have reviewed the patients History and Physical, chart, labs and discussed the procedure including the risks, benefits and alternatives for the proposed anesthesia with the patient or authorized representative who has indicated his/her understanding and acceptance.       Plan Discussed with: Anesthesiologist  Anesthesia Plan Comments:         Anesthesia Quick Evaluation

## 2022-07-06 NOTE — Anesthesia Procedure Notes (Signed)
Epidural Patient location during procedure: OB Start time: 07/06/2022 7:28 PM End time: 07/06/2022 7:36 PM  Preanesthetic Checklist Completed: patient identified, IV checked, site marked, risks and benefits discussed, surgical consent, monitors and equipment checked, pre-op evaluation and timeout performed  Epidural Patient position: sitting Prep: DuraPrep and site prepped and draped Patient monitoring: continuous pulse ox and blood pressure Approach: midline Injection technique: LOR air  Needle:  Needle type: Tuohy  Needle gauge: 17 G Needle length: 9 cm and 9 Needle insertion depth: 5 cm Catheter type: closed end flexible Catheter size: 19 Gauge Catheter at skin depth: 10 cm Test dose: negative and Other  Assessment Events: blood not aspirated, injection not painful, no injection resistance, no paresthesia and negative IV test  Additional Notes Patient identified. Risks and benefits discussed including failed block, incomplete  Pain control, post dural puncture headache, nerve damage, paralysis, blood pressure Changes, nausea, vomiting, reactions to medications-both toxic and allergic and post Partum back pain. All questions were answered. Patient expressed understanding and wished to proceed. Sterile technique was used throughout procedure. Epidural site was Dressed with sterile barrier dressing. No paresthesias, signs of intravascular injection Or signs of intrathecal spread were encountered.  Patient was more comfortable after the epidural was dosed. Please see RN's note for documentation of vital signs and FHR which are stable. Reason for block:procedure for pain

## 2022-07-06 NOTE — Discharge Summary (Signed)
Postpartum Discharge Summary  Date of Service updated***     Patient Name: Deanna Patterson DOB: 1996-01-17 MRN: [id number]  Date of admission: 07/06/2022 Delivery date:07/06/2022  Delivering provider: Stormy Card  Date of discharge: 07/06/2022  Admitting diagnosis: Gestational diabetes [O24.419] Intrauterine pregnancy: [redacted]w[redacted]d    Secondary diagnosis:  Principal Problem:   SVD (spontaneous vaginal delivery) Active Problems:   Gestational diabetes  Additional problems: ***    Discharge diagnosis: Term Pregnancy Delivered and GDM A1                                              Post partum procedures:{Postpartum procedures:23558} Augmentation: AROM, Pitocin, Cytotec, and IP Foley Complications: None  Hospital course: Induction of Labor With Vaginal Delivery   26y.o. yo G3P2002 at 311w1das admitted to the hospital 07/06/2022 for induction of labor.  Indication for induction: A1 DM.  Patient had an uncomplicated labor course as follows: Membrane Rupture Time/Date: 6:47 PM ,07/06/2022   Delivery Method:Vaginal, Spontaneous  Episiotomy: None  Lacerations:  None  Details of delivery can be found in separate delivery note.  Patient had a routine postpartum course. Patient is discharged home 07/06/22.  Newborn Data: Birth date:07/06/2022  Birth time:8:36 PM  Gender:Female  Living status:Living  Apgars:9 ,9  Weight:   Magnesium Sulfate received: No BMZ received: No Rhophylac:N/A MMR:N/A, Rubella Immune T-DaP:Given prenatally Flu: N/A Transfusion:{Transfusion received:30440034}  Physical exam  Vitals:   07/06/22 2046 07/06/22 2104 07/06/22 2116 07/06/22 2131  BP: 128/73 125/80 122/69 115/62  Pulse: 88 78 71 75  Resp:  16    Temp:  98.7 F (37.1 C)    TempSrc:  Oral    SpO2:      Weight:      Height:       General: {Exam; general:21111117} Lochia: {Desc; appropriate/inappropriate:30686::"appropriate"} Uterine Fundus: {Desc; firm/soft:30687} Incision:  {Exam; incision:21111123} DVT Evaluation: {Exam; dvt:2111122} Labs: Lab Results  Component Value Date   WBC 7.1 07/06/2022   HGB 11.7 (L) 07/06/2022   HCT 34.8 (L) 07/06/2022   MCV 96.4 07/06/2022   PLT 220 07/06/2022      Latest Ref Rng & Units 06/14/2020    3:48 AM  CMP  Glucose 70 - 99 mg/dL 107   BUN 6 - 20 mg/dL 6   Creatinine 0.44 - 1.00 mg/dL 0.59   Sodium 135 - 145 mmol/L 137   Potassium 3.5 - 5.1 mmol/L 4.0   Chloride 98 - 111 mmol/L 106   CO2 22 - 32 mmol/L 23   Calcium 8.9 - 10.3 mg/dL 9.4   Total Protein 6.5 - 8.1 g/dL 6.6   Total Bilirubin 0.3 - 1.2 mg/dL 0.7   Alkaline Phos 38 - 126 U/L 46   AST 15 - 41 U/L 24   ALT 0 - 44 U/L 35    Edinburgh Score:    03/20/2021    1:23 PM  Edinburgh Postnatal Depression Scale Screening Tool  I have been able to laugh and see the funny side of things. 0  I have looked forward with enjoyment to things. 0  I have blamed myself unnecessarily when things went wrong. 0  I have been anxious or worried for no good reason. 0  I have felt scared or panicky for no good reason. 0  Things have been getting on top of me.  0  I have been so unhappy that I have had difficulty sleeping. 0  I have felt sad or miserable. 0  I have been so unhappy that I have been crying. 0  The thought of harming myself has occurred to me. 0  Edinburgh Postnatal Depression Scale Total 0     After visit meds:  Allergies as of 07/06/2022       Reactions   Peanut-containing Drug Products Rash, Shortness Of Breath, Swelling   Peanuts [peanut Oil] Swelling   Swelling of the lips, tongue and throat.   Penicillins Rash     Med Rec must be completed prior to using this [place]***        Discharge home in stable condition Infant Feeding: Breast Infant Disposition:home with mother Discharge instruction: per After Visit Summary and Postpartum booklet. Activity: Advance as tolerated. Pelvic rest for 6 weeks.  Diet: routine diet Future  Appointments: Future Appointments  Date Time Provider [place]  07/10/2022 11:15 AM Follett Sink [facility]  07/17/2022  1:15 PM [facility] NST [facility]  07/17/2022  2:15 PM Kooistra, Mervyn Skeeters, CNM [facility]   Follow up Visit:  The following message was sent to [facility] by Mikki Santee, MD  Please schedule this patient for a In person postpartum visit in 6 weeks with the following provider: Any provider. Additional Postpartum F/U:2 hour GTT  High risk pregnancy complicated by: GDM Delivery mode:  Vaginal, Spontaneous  Anticipated Birth Control:   planning for post partum Depo   07/06/2022 Dorrie Cocuzza Sherrilyn Rist, MD

## 2022-07-06 NOTE — Lactation Note (Signed)
This note was copied from a baby's chart. Lactation Consultation Note  Patient Name: Deanna Patterson [id number] Date: 07/06/2022 Reason for consult: L&D Initial assessment;Term Age:27 hours Experienced BF parent had baby BF on the breast when LC arrived. Praised mom for good latch. Answered the few questions mom had. Reviewed newborn feedings verses toddler feedings. Reviewed body alignment of baby during feeding. Encouraged to call for Lactation if needed tonight. Maternal Data Does the patient have breastfeeding experience prior to this delivery?: Yes How long did the patient breastfeed?: 2 yrs to her 27 yr old, 7 months to her 1 1/27 yr old  Feeding    LATCH Score Latch: Grasps breast easily, tongue down, lips flanged, rhythmical sucking.  Audible Swallowing: A few with stimulation  Type of Nipple: Everted at rest and after stimulation  Comfort (Breast/Nipple): Soft / non-tender  Hold (Positioning): No assistance needed to correctly position infant at breast.  LATCH Score: 9   Lactation Tools Discussed/Used    Interventions Interventions: Skin to skin  Discharge WIC Program: No  Consult Status Consult Status: Follow-up from L&D Date: 07/07/22 Follow-up type: In-patient    Charyl Dancer 07/06/2022, 9:20 PM

## 2022-07-07 LAB — CBC
HCT: 31.6 % — ABNORMAL LOW (ref 36.0–46.0)
Hemoglobin: 10.8 g/dL — ABNORMAL LOW (ref 12.0–15.0)
MCH: 32.1 pg (ref 26.0–34.0)
MCHC: 34.2 g/dL (ref 30.0–36.0)
MCV: 94 fL (ref 80.0–100.0)
Platelets: 201 10*3/uL (ref 150–400)
RBC: 3.36 MIL/uL — ABNORMAL LOW (ref 3.87–5.11)
RDW: 14.2 % (ref 11.5–15.5)
WBC: 10.4 10*3/uL (ref 4.0–10.5)
nRBC: 0 % (ref 0.0–0.2)

## 2022-07-07 LAB — RPR: RPR Ser Ql: NONREACTIVE

## 2022-07-07 MED ORDER — MEDROXYPROGESTERONE ACETATE 150 MG/ML IM SUSP
150.0000 mg | Freq: Once | INTRAMUSCULAR | Status: AC
  Administered 2022-07-08: 150 mg via INTRAMUSCULAR
  Filled 2022-07-07: qty 1

## 2022-07-07 NOTE — Anesthesia Postprocedure Evaluation (Signed)
Anesthesia Post Note  Patient: Aashika Carta Mcduffey  Procedure(s) Performed: AN AD HOC LABOR EPIDURAL     Patient location during evaluation: Mother Baby Anesthesia Type: Epidural Level of consciousness: awake, awake and alert and oriented Pain management: pain level controlled Vital Signs Assessment: post-procedure vital signs reviewed and stable Respiratory status: spontaneous breathing, nonlabored ventilation and respiratory function stable Cardiovascular status: stable Postop Assessment: patient able to bend at knees, no apparent nausea or vomiting, adequate PO intake, able to ambulate, no backache and no headache Anesthetic complications: no   No notable events documented.  Last Vitals:  Vitals:   07/07/22 0430 07/07/22 0901  BP: 117/69 112/79  Pulse: 69 72  Resp: 16   Temp: 36.8 C   SpO2: 100%     Last Pain:  Vitals:   07/07/22 0430  TempSrc: Oral  PainSc: 2    Pain Goal:                   Catera Hankins

## 2022-07-07 NOTE — Progress Notes (Signed)
POSTPARTUM PROGRESS NOTE  Subjective: Deanna Patterson is a 26 y.o. G3P3003 s/p SVD at [redacted]w[redacted]d.  She reports she doing well. No acute events overnight. She denies any problems with ambulating, voiding or po intake. Denies nausea or vomiting. She has passed flatus. Pain is well controlled.  Lochia is appropriate.  Objective: Blood pressure 117/69, pulse 69, temperature 98.3 F (36.8 C), temperature source Oral, resp. rate 16, height 5' (1.524 m), weight 77.1 kg, SpO2 100 %, unknown if currently breastfeeding.  Physical Exam:  General: alert, cooperative and no distress Chest: no respiratory distress Abdomen: soft, non-tender  Uterine Fundus: firm and at level of umbilicus Extremities: No calf swelling or tenderness  no edema  Recent Labs    07/06/22 1410 07/07/22 0424  HGB 11.7* 10.8*  HCT 34.8* 31.6*    Assessment/Plan: Deanna Patterson is a 26 y.o. G3P3003 s/p SVD at [redacted]w[redacted]d.  Routine Postpartum Care: Doing well, pain well-controlled.  -- Continue routine care, lactation support  -- Contraception: depo, order tomorrow.  -- Feeding: breast  Dispo: Plan for discharge tomorrow.  Glendale Chard, DO Faculty Practice, Center for [facility] 07/07/2022 8:01 AM

## 2022-07-07 NOTE — Lactation Note (Addendum)
This note was copied from a baby's chart. Lactation Consultation Note  Patient Name: Deanna Patterson [id number] Date: 07/07/2022 Reason for consult: Follow-up assessment Age:27 hours  P3, Experienced with breastfeeding.  Denies questions or concerns.  Feed on demand with cues.  Goal 8-12+ times per day after first 24 hrs.  Place baby STS if not cueing. Discussed basics. Mom made aware of O/P services, breastfeeding support groups,  and our phone # for post-discharge questions.     Maternal Data Has patient been taught Hand Expression?: Yes Does the patient have breastfeeding experience prior to this delivery?: Yes How long did the patient breastfeed?: 2 years and 2 mos. with 2nd child  Feeding Mother's Current Feeding Choice: Breast Milk   Interventions Interventions: Education  Consult Status Consult Status: Follow-up Date: 07/08/22 Follow-up type: In-patient    Dahlia Byes [facility] 07/07/2022, 3:05 PM

## 2022-07-08 NOTE — Lactation Note (Signed)
This note was copied from a baby's chart. Lactation Consultation Note  Patient Name: Deanna Patterson Date: 07/08/2022 Reason for consult: Follow-up assessment;Infant < 6lbs Age:27 hours  P3, Birth parent states breastfeeding is going well. Discussed waking baby if needed < 6 lbs.  Reviewed engorgement care and monitoring voids/stools. Feed on demand with cues.  Goal 8-12+ times per day after first 24 hrs.  Place baby STS if not cueing.    Feeding Mother's Current Feeding Choice: Breast Milk   Interventions Interventions: Education  Discharge Discharge Education: Engorgement and breast care;Warning signs for feeding baby  Consult Status Consult Status: Complete Date: 07/08/22    Dahlia Byes [facility] 07/08/2022, 10:07 AM

## 2022-07-16 ENCOUNTER — Telehealth (HOSPITAL_COMMUNITY): Payer: Self-pay | Admitting: *Deleted

## 2022-07-16 NOTE — Telephone Encounter (Signed)
Hospital Discharge Follow-Up Call:  Patient reports that she is well and has no concerns about her healing process.  EPDS today was 0 and she endorses this accurately reflects that she is doing well emotionally.  Patient says that baby is well and she has no concerns about baby's health.  She reports that baby sleeps in a bassinet.  Reviewed ABCs of Safe Sleep. 

## 2022-07-17 ENCOUNTER — Other Ambulatory Visit: Payer: 59

## 2022-07-17 ENCOUNTER — Encounter: Payer: 59 | Admitting: Student

## 2022-07-21 ENCOUNTER — Ambulatory Visit
Admission: RE | Admit: 2022-07-21 | Discharge: 2022-07-21 | Disposition: A | Discharge status: Home / Self Care | Payer: Medicaid Other | Source: Ambulatory Visit | Attending: Physician Assistant | Admitting: Physician Assistant

## 2022-07-21 ENCOUNTER — Ambulatory Visit (INDEPENDENT_AMBULATORY_CARE_PROVIDER_SITE_OTHER): Payer: Medicaid Other

## 2022-07-21 VITALS — BP 126/92 | HR 70 | Temp 98.6°F | Resp 16

## 2022-07-21 DIAGNOSIS — M25572 Pain in left ankle and joints of left foot: Secondary | ICD-10-CM

## 2022-07-21 DIAGNOSIS — S92355A Nondisplaced fracture of fifth metatarsal bone, left foot, initial encounter for closed fracture: Secondary | ICD-10-CM

## 2022-07-21 DIAGNOSIS — S92354A Nondisplaced fracture of fifth metatarsal bone, right foot, initial encounter for closed fracture: Secondary | ICD-10-CM | POA: Diagnosis not present

## 2022-07-21 DIAGNOSIS — W109XXA Fall (on) (from) unspecified stairs and steps, initial encounter: Secondary | ICD-10-CM

## 2022-07-21 NOTE — ED Provider Notes (Addendum)
[facility]    CSN: [id number] Arrival date & time: 07/21/22  0844      History   Chief Complaint Chief Complaint  Patient presents with   Foot Injury    Swelling, difficulty walking, sharp pain, difficulty applying pressure. - Entered by patient    HPI Deanna Patterson is a 26 y.o. female.   Here today for evaluation of left foot injury that occurred last night.  She reports that she missed her last step trying to avoid her husband shoes at the bottom of the stairway and in doing so she twisted her left foot inward.  She reports immediate pain and swelling to the lateral aspect of her midfoot.  She has pain with weightbearing and has had difficulty walking since that time.  She does not report any medication for treatment.  The history is provided by the patient.  Foot Injury Associated symptoms: no fever     Past Medical History:  Diagnosis Date   Asthma    GBS bacteriuria 05/30/2020   +GBS in urine 05/24/20  PCN allergy    Gestational diabetes    Migraines     Patient Active Problem List   Diagnosis Date Noted   Gestational diabetes 07/06/2022   SVD (spontaneous vaginal delivery) 07/06/2022   Gestational diabetes mellitus (GDM), antepartum 05/13/2022   Supervision of other normal pregnancy, antepartum 12/25/2021   Asthma    Other fatigue 03/29/2020   Intractable migraine with aura without status migrainosus 03/29/2020   Vitamin D deficiency 03/29/2020   Atypical squamous cells of undetermined significance (ASCUS) on Papanicolaou smear of cervix 04/30/2017    Past Surgical History:  Procedure Laterality Date   NO PAST SURGERIES      OB History     Gravida  3   Para  3   Term  3   Preterm  0   AB  0   Living  3      SAB  0   IAB  0   Ectopic  0   Multiple  0   Live Births  3            Home Medications    Prior to Admission medications   Medication Sig Start Date End Date Taking? Authorizing Provider   acetaminophen (TYLENOL) 325 MG tablet Take 650 mg by mouth every 6 (six) hours as needed.    [provider]  albuterol (VENTOLIN HFA) 108 (90 Base) MCG/ACT inhaler Inhale 1-2 puffs into the lungs every 6 (six) hours as needed for wheezing or shortness of breath. 12/07/20   Gabriel Carina, CNM  blood glucose meter kit and supplies KIT Dispense based on patient and insurance preference. Use up to four times daily as directed. 05/10/22   Gabriel Carina, CNM    Family History Family History  Problem Relation Age of Onset   Hyperlipidemia Mother    Hypertension Mother    Thyroid disease Mother    Hyperlipidemia Maternal Grandmother    Hypertension Maternal Grandmother     Social History Social History   Tobacco Use   Smoking status: Never   Smokeless tobacco: Never  Vaping Use   Vaping Use: Never used  Substance Use Topics   Alcohol use: Not Currently    Alcohol/week: 0.0 standard drinks of alcohol    Comment: wine daily until pregnancy   Drug use: No     Allergies   Peanut-containing drug products, Peanuts [peanut oil], and Penicillins  Review of Systems Review of Systems  Constitutional:  Negative for chills and fever.  Eyes:  Negative for discharge and redness.  Respiratory:  Negative for shortness of breath.   Gastrointestinal:  Negative for diarrhea and vomiting.  Musculoskeletal:  Positive for arthralgias and gait problem.  Skin:  Negative for color change.  Neurological:  Negative for numbness.     Physical Exam Triage Vital Signs ED Triage Vitals [07/21/22 0905]  Enc Vitals Group     BP (!) 126/92     Pulse Rate 70     Resp 16     Temp 98.6 F (37 C)     Temp Source Oral     SpO2 98 %     Weight      Height      Head Circumference      Peak Flow      Pain Score 9     Pain Loc      Pain Edu?      Excl. in [place]?    No data found.  Updated Vital Signs BP (!) 126/92 (BP Location: Left Arm)   Pulse 70   Temp 98.6 F (37 C) (Oral)    Resp 16   LMP  (LMP Unknown)   SpO2 98%   Breastfeeding Yes      Physical Exam Vitals and nursing note reviewed.  Constitutional:      General: She is not in acute distress.    Appearance: Normal appearance. She is not ill-appearing.  HENT:     Head: Normocephalic and atraumatic.  Eyes:     Conjunctiva/sclera: Conjunctivae normal.  Cardiovascular:     Rate and Rhythm: Normal rate.  Pulmonary:     Effort: Pulmonary effort is normal.  Musculoskeletal:     Comments: Mild swelling and TTP noted to lateral aspect of left mid foot (base of 5th metartarsal) Decreased ROM of left ankle due to pain in left foot, full ROM of left toes  Skin:    General: Skin is warm and dry.     Capillary Refill: Cap refill normal to left toes Neurological:     Mental Status: She is alert.     Comments: Gross sensation intact to left toes   Psychiatric:        Mood and Affect: Mood normal.        Behavior: Behavior normal.      UC Treatments / Results  Labs (all labs ordered are listed, but only abnormal results are displayed) Labs Reviewed - No data to display  EKG   Radiology No results found.  Procedures Procedures (including critical care time)  Medications Ordered in UC Medications - No data to display  Initial Impression / Assessment and Plan / UC Course  I have reviewed the triage vital signs and the nursing notes.  Pertinent labs & imaging results that were available during my care of the patient were reviewed by me and considered in my medical decision making (see chart for details).   CAM walker boot provided and recommended follow up with ortho- encouraged patient to call today for appointment. Recommended ibuprofen or tylenol if needed for pain and follow up with any further concerns.    Final Clinical Impressions(s) / UC Diagnoses   Final diagnoses:  Closed nondisplaced fracture of fifth metatarsal bone of left foot, initial encounter     Discharge Instructions        Use ibuprofen or tylenol if needed for pain.   Please  follow up with ortho.     ED Prescriptions   None    PDMP not reviewed this encounter.   Francene Finders, PA-C 07/21/22 1039    Francene Finders, PA-C 08/05/22 1911

## 2022-07-21 NOTE — Discharge Instructions (Signed)
  Use ibuprofen or tylenol if needed for pain.   Please follow up with ortho.

## 2022-07-21 NOTE — ED Triage Notes (Signed)
Pt c/o falling on last step in house and "foot went sideways" now swollen and painful. Occurred last night.

## 2022-08-05 ENCOUNTER — Other Ambulatory Visit: Payer: Self-pay

## 2022-08-05 DIAGNOSIS — O24419 Gestational diabetes mellitus in pregnancy, unspecified control: Secondary | ICD-10-CM

## 2022-08-06 ENCOUNTER — Other Ambulatory Visit: Payer: Medicaid Other

## 2022-08-06 ENCOUNTER — Encounter: Payer: Self-pay | Admitting: Certified Nurse Midwife

## 2022-08-06 ENCOUNTER — Other Ambulatory Visit: Payer: Self-pay

## 2022-08-06 ENCOUNTER — Ambulatory Visit (INDEPENDENT_AMBULATORY_CARE_PROVIDER_SITE_OTHER): Payer: BC Managed Care – PPO | Admitting: Certified Nurse Midwife

## 2022-08-06 DIAGNOSIS — Z8632 Personal history of gestational diabetes: Secondary | ICD-10-CM

## 2022-08-06 DIAGNOSIS — O24419 Gestational diabetes mellitus in pregnancy, unspecified control: Secondary | ICD-10-CM

## 2022-08-06 NOTE — Progress Notes (Signed)
    Postpartum Visit Note  Deanna Patterson is a 26 y.o. [id number] female who presents for a postpartum visit. She is 4 weeks postpartum following a normal spontaneous vaginal delivery.  I have fully reviewed the prenatal and intrapartum course. The delivery was at 39 gestational weeks.  Anesthesia: epidural. Postpartum course has been uncomplicated. Baby is doing well. Baby is feeding by breast. Bleeding no bleeding. Bowel function is normal. Bladder function is normal. Patient is sexually active. Contraception method is Depo-Provera injections (received at the hospital). Postpartum depression screening: negative.  PISQ not assessed, pt already has contraception in place and plans to continue it.  Health Maintenance Due  Topic Date Due   COVID-19 Vaccine (1) Never done   HPV VACCINES (2 - 3-dose series) 11/04/2011   INFLUENZA VACCINE  Never done   The following portions of the patient's history were reviewed and updated as appropriate: allergies, current medications, past family history, past medical history, past social history, past surgical history, and problem list.  Review of Systems Pertinent items noted in HPI and remainder of comprehensive ROS otherwise negative.  Objective:  LMP has not returned, on Depo since delivery  Constitutional: Alert, oriented female in no physical distress.  HEENT: PERRLA Skin: normal color and turgor, no rash Cardiovascular: normal rate & rhythm, warm and well perfused Respiratory: normal effort, no problems with respiration noted GI: Abd soft, non-tender MS: Extremities nontender, no edema, normal ROM Neurologic: Alert and oriented x 4.  GU: no CVA tenderness Pelvic: exam deferred, pap up to date Breasts: normal lactating breasts, no complaints Incision: N/A  Assessment:  1. Postpartum care and examination - Normal postpartum exam, mom and family adjusting well  2. History of gestational diabetes - Completing 2hr GTT today  Plan:   Essential components of care per ACOG recommendations:  1.  Mood and well being: Patient with negative depression screening today. Reviewed local resources for support.  - Patient tobacco use? No.   - hx of drug use? No.    2. Infant care and feeding:  -Patient currently breastmilk feeding? Yes. Reviewed importance of draining breast regularly to support lactation.  -Social determinants of health (SDOH) reviewed in EPIC. No concerns  3. Sexuality, contraception and birth spacing - Patient does not want a pregnancy in the next year.  Desired family size is 3 children.  - Reviewed reproductive life planning. Reviewed contraceptive methods based on pt preferences and effectiveness.  Patient desired Hormonal Injection today.   - Discussed birth spacing of 18 months  4. Sleep and fatigue -Encouraged family/partner/community support of 4 hrs of uninterrupted sleep to help with mood and fatigue  5. Physical Recovery  - Discussed patients delivery and complications. She describes her labor as good. - Patient had a Vaginal, no problems at delivery. Patient had  no  laceration. Perineal healing reviewed. Patient expressed understanding - Patient has urinary incontinence? No. - Patient is safe to resume physical and sexual activity  6.  Health Maintenance - HM due items addressed Yes - Last pap smear  Diagnosis  Date Value Ref Range Status  12/24/2021   Final   - Negative for intraepithelial lesion or malignancy (NILM)   Pap smear not done at today's visit.  -Breast Cancer screening indicated? No.   7. Chronic Disease/Pregnancy Condition follow up: Gestational Diabetes - PCP follow up as needed  Bernerd Limbo, CNM [facility] [employer], [facility]

## 2022-08-07 LAB — GLUCOSE TOLERANCE, 2 HOURS
Glucose, 2 hour: 56 mg/dL — ABNORMAL LOW (ref 70–139)
Glucose, GTT - Fasting: 78 mg/dL (ref 70–99)

## 2022-10-06 ENCOUNTER — Encounter: Payer: Self-pay | Admitting: *Deleted

## 2022-10-06 ENCOUNTER — Ambulatory Visit (INDEPENDENT_AMBULATORY_CARE_PROVIDER_SITE_OTHER): Payer: BC Managed Care – PPO | Admitting: *Deleted

## 2022-10-06 ENCOUNTER — Other Ambulatory Visit: Payer: Self-pay

## 2022-10-06 VITALS — BP 127/95 | HR 83 | Ht 60.0 in | Wt 149.3 lb

## 2022-10-06 DIAGNOSIS — Z3042 Encounter for surveillance of injectable contraceptive: Secondary | ICD-10-CM | POA: Diagnosis not present

## 2022-10-06 MED ORDER — MEDROXYPROGESTERONE ACETATE 150 MG/ML IM SUSP
150.0000 mg | Freq: Once | INTRAMUSCULAR | Status: AC
  Administered 2022-10-06: 150 mg via INTRAMUSCULAR

## 2022-10-06 NOTE — Progress Notes (Signed)
Here for Depo-provera. Last depo-provera given 07/08/22. Last exam was postpartum 08/06/22. Last pap was 12/24/21. States she is sporadically spotting and in the past when she took depo-provera the provera approved her to take it more often - like every 2 months. I explained I will forward to provider for their approval. She tolerated injection without complaint. Sent to registrar to schedule next injection.  [name]

## 2022-10-09 ENCOUNTER — Telehealth: Payer: Self-pay | Admitting: Family Medicine

## 2022-10-09 NOTE — Telephone Encounter (Signed)
Called patient to obtain insurance demographics for [employer]. There was no answer to the phone call so a voicemail was left with the call back number for the office.

## 2022-12-01 ENCOUNTER — Ambulatory Visit: Payer: BC Managed Care – PPO

## 2022-12-22 ENCOUNTER — Ambulatory Visit: Payer: BC Managed Care – PPO
